# Patient Record
Sex: Female | Born: 1950 | Race: White | Hispanic: Refuse to answer | Marital: Married | Smoking: Never smoker
Health system: Southern US, Community
[De-identification: ages and names within clinical notes are randomized; demographics above are authoritative.]

## PROBLEM LIST (undated history)

## (undated) DIAGNOSIS — I498 Other specified cardiac arrhythmias: Secondary | ICD-10-CM

## (undated) DIAGNOSIS — G43009 Migraine without aura, not intractable, without status migrainosus: Secondary | ICD-10-CM

## (undated) DIAGNOSIS — I10 Essential (primary) hypertension: Secondary | ICD-10-CM

## (undated) DIAGNOSIS — E034 Atrophy of thyroid (acquired): Secondary | ICD-10-CM

## (undated) DIAGNOSIS — I493 Ventricular premature depolarization: Secondary | ICD-10-CM

## (undated) DIAGNOSIS — J4520 Mild intermittent asthma, uncomplicated: Secondary | ICD-10-CM

## (undated) DIAGNOSIS — Z9889 Other specified postprocedural states: Secondary | ICD-10-CM

## (undated) DIAGNOSIS — J45909 Unspecified asthma, uncomplicated: Secondary | ICD-10-CM

## (undated) DIAGNOSIS — H6011 Cellulitis of right external ear: Secondary | ICD-10-CM

## (undated) HISTORY — DX: Other specified cardiac arrhythmias: I49.8

## (undated) HISTORY — DX: Unspecified asthma, uncomplicated: J45.909

## (undated) HISTORY — PX: CHOLECYSTECTOMY: SHX55

## (undated) HISTORY — DX: Atrophy of thyroid (acquired): E03.4

## (undated) HISTORY — PX: ABDOMINAL HYSTERECTOMY: SHX81

## (undated) HISTORY — DX: Migraine without aura, not intractable, without status migrainosus: G43.009

## (undated) HISTORY — DX: Mild intermittent asthma, uncomplicated: J45.20

## (undated) HISTORY — DX: Ventricular premature depolarization: I49.3

## (undated) HISTORY — DX: Essential (primary) hypertension: I10

## (undated) HISTORY — DX: Cellulitis of right external ear: H60.11

## (undated) HISTORY — DX: Other specified postprocedural states: Z98.890

---

## 2005-09-24 ENCOUNTER — Ambulatory Visit (HOSPITAL_BASED_OUTPATIENT_CLINIC_OR_DEPARTMENT_OTHER): Admission: RE | Admit: 2005-09-24 | Discharge: 2005-09-24 | Payer: Self-pay | Admitting: Orthopedic Surgery

## 2015-04-12 DIAGNOSIS — C44722 Squamous cell carcinoma of skin of right lower limb, including hip: Secondary | ICD-10-CM | POA: Diagnosis not present

## 2015-04-12 DIAGNOSIS — L300 Nummular dermatitis: Secondary | ICD-10-CM | POA: Diagnosis not present

## 2015-04-12 DIAGNOSIS — L820 Inflamed seborrheic keratosis: Secondary | ICD-10-CM | POA: Diagnosis not present

## 2015-05-15 DIAGNOSIS — J018 Other acute sinusitis: Secondary | ICD-10-CM | POA: Diagnosis not present

## 2015-07-03 DIAGNOSIS — I10 Essential (primary) hypertension: Secondary | ICD-10-CM | POA: Diagnosis not present

## 2015-07-03 DIAGNOSIS — I498 Other specified cardiac arrhythmias: Secondary | ICD-10-CM | POA: Diagnosis not present

## 2015-11-23 DIAGNOSIS — G43801 Other migraine, not intractable, with status migrainosus: Secondary | ICD-10-CM | POA: Diagnosis not present

## 2015-11-23 DIAGNOSIS — H43813 Vitreous degeneration, bilateral: Secondary | ICD-10-CM | POA: Diagnosis not present

## 2015-11-29 DIAGNOSIS — G43009 Migraine without aura, not intractable, without status migrainosus: Secondary | ICD-10-CM | POA: Diagnosis not present

## 2015-12-12 DIAGNOSIS — G43909 Migraine, unspecified, not intractable, without status migrainosus: Secondary | ICD-10-CM | POA: Diagnosis not present

## 2015-12-12 DIAGNOSIS — R51 Headache: Secondary | ICD-10-CM | POA: Diagnosis not present

## 2016-01-08 DIAGNOSIS — I10 Essential (primary) hypertension: Secondary | ICD-10-CM | POA: Diagnosis not present

## 2016-02-26 DIAGNOSIS — M542 Cervicalgia: Secondary | ICD-10-CM | POA: Diagnosis not present

## 2016-03-08 DIAGNOSIS — M47892 Other spondylosis, cervical region: Secondary | ICD-10-CM | POA: Diagnosis not present

## 2016-03-08 DIAGNOSIS — M542 Cervicalgia: Secondary | ICD-10-CM | POA: Diagnosis not present

## 2016-03-08 DIAGNOSIS — M8588 Other specified disorders of bone density and structure, other site: Secondary | ICD-10-CM | POA: Diagnosis not present

## 2016-03-08 DIAGNOSIS — M47893 Other spondylosis, cervicothoracic region: Secondary | ICD-10-CM | POA: Diagnosis not present

## 2016-03-20 DIAGNOSIS — M6281 Muscle weakness (generalized): Secondary | ICD-10-CM | POA: Diagnosis not present

## 2016-03-20 DIAGNOSIS — M47892 Other spondylosis, cervical region: Secondary | ICD-10-CM | POA: Diagnosis not present

## 2016-03-20 DIAGNOSIS — M542 Cervicalgia: Secondary | ICD-10-CM | POA: Diagnosis not present

## 2016-03-20 DIAGNOSIS — S161XXD Strain of muscle, fascia and tendon at neck level, subsequent encounter: Secondary | ICD-10-CM | POA: Diagnosis not present

## 2016-03-20 DIAGNOSIS — R293 Abnormal posture: Secondary | ICD-10-CM | POA: Diagnosis not present

## 2016-03-22 DIAGNOSIS — M6281 Muscle weakness (generalized): Secondary | ICD-10-CM | POA: Diagnosis not present

## 2016-03-22 DIAGNOSIS — R293 Abnormal posture: Secondary | ICD-10-CM | POA: Diagnosis not present

## 2016-03-22 DIAGNOSIS — M47892 Other spondylosis, cervical region: Secondary | ICD-10-CM | POA: Diagnosis not present

## 2016-03-22 DIAGNOSIS — M542 Cervicalgia: Secondary | ICD-10-CM | POA: Diagnosis not present

## 2016-03-22 DIAGNOSIS — S161XXD Strain of muscle, fascia and tendon at neck level, subsequent encounter: Secondary | ICD-10-CM | POA: Diagnosis not present

## 2016-03-25 DIAGNOSIS — M542 Cervicalgia: Secondary | ICD-10-CM | POA: Diagnosis not present

## 2016-03-25 DIAGNOSIS — M6281 Muscle weakness (generalized): Secondary | ICD-10-CM | POA: Diagnosis not present

## 2016-03-25 DIAGNOSIS — S161XXD Strain of muscle, fascia and tendon at neck level, subsequent encounter: Secondary | ICD-10-CM | POA: Diagnosis not present

## 2016-03-25 DIAGNOSIS — M47892 Other spondylosis, cervical region: Secondary | ICD-10-CM | POA: Diagnosis not present

## 2016-03-25 DIAGNOSIS — R293 Abnormal posture: Secondary | ICD-10-CM | POA: Diagnosis not present

## 2016-03-27 DIAGNOSIS — M47892 Other spondylosis, cervical region: Secondary | ICD-10-CM | POA: Diagnosis not present

## 2016-03-27 DIAGNOSIS — R293 Abnormal posture: Secondary | ICD-10-CM | POA: Diagnosis not present

## 2016-03-27 DIAGNOSIS — S161XXD Strain of muscle, fascia and tendon at neck level, subsequent encounter: Secondary | ICD-10-CM | POA: Diagnosis not present

## 2016-03-27 DIAGNOSIS — M542 Cervicalgia: Secondary | ICD-10-CM | POA: Diagnosis not present

## 2016-03-27 DIAGNOSIS — M6281 Muscle weakness (generalized): Secondary | ICD-10-CM | POA: Diagnosis not present

## 2016-03-29 DIAGNOSIS — R293 Abnormal posture: Secondary | ICD-10-CM | POA: Diagnosis not present

## 2016-03-29 DIAGNOSIS — S161XXD Strain of muscle, fascia and tendon at neck level, subsequent encounter: Secondary | ICD-10-CM | POA: Diagnosis not present

## 2016-03-29 DIAGNOSIS — M6281 Muscle weakness (generalized): Secondary | ICD-10-CM | POA: Diagnosis not present

## 2016-03-29 DIAGNOSIS — M542 Cervicalgia: Secondary | ICD-10-CM | POA: Diagnosis not present

## 2016-03-29 DIAGNOSIS — M47892 Other spondylosis, cervical region: Secondary | ICD-10-CM | POA: Diagnosis not present

## 2016-04-01 DIAGNOSIS — R293 Abnormal posture: Secondary | ICD-10-CM | POA: Diagnosis not present

## 2016-04-01 DIAGNOSIS — M6281 Muscle weakness (generalized): Secondary | ICD-10-CM | POA: Diagnosis not present

## 2016-04-01 DIAGNOSIS — M542 Cervicalgia: Secondary | ICD-10-CM | POA: Diagnosis not present

## 2016-04-01 DIAGNOSIS — S161XXD Strain of muscle, fascia and tendon at neck level, subsequent encounter: Secondary | ICD-10-CM | POA: Diagnosis not present

## 2016-04-01 DIAGNOSIS — M47892 Other spondylosis, cervical region: Secondary | ICD-10-CM | POA: Diagnosis not present

## 2016-04-03 DIAGNOSIS — M47892 Other spondylosis, cervical region: Secondary | ICD-10-CM | POA: Diagnosis not present

## 2016-04-03 DIAGNOSIS — R293 Abnormal posture: Secondary | ICD-10-CM | POA: Diagnosis not present

## 2016-04-03 DIAGNOSIS — M6281 Muscle weakness (generalized): Secondary | ICD-10-CM | POA: Diagnosis not present

## 2016-04-03 DIAGNOSIS — S161XXD Strain of muscle, fascia and tendon at neck level, subsequent encounter: Secondary | ICD-10-CM | POA: Diagnosis not present

## 2016-04-03 DIAGNOSIS — M542 Cervicalgia: Secondary | ICD-10-CM | POA: Diagnosis not present

## 2016-04-05 DIAGNOSIS — R293 Abnormal posture: Secondary | ICD-10-CM | POA: Diagnosis not present

## 2016-04-05 DIAGNOSIS — M542 Cervicalgia: Secondary | ICD-10-CM | POA: Diagnosis not present

## 2016-04-05 DIAGNOSIS — M6281 Muscle weakness (generalized): Secondary | ICD-10-CM | POA: Diagnosis not present

## 2016-04-05 DIAGNOSIS — M47892 Other spondylosis, cervical region: Secondary | ICD-10-CM | POA: Diagnosis not present

## 2016-04-05 DIAGNOSIS — S161XXD Strain of muscle, fascia and tendon at neck level, subsequent encounter: Secondary | ICD-10-CM | POA: Diagnosis not present

## 2016-04-09 DIAGNOSIS — R293 Abnormal posture: Secondary | ICD-10-CM | POA: Diagnosis not present

## 2016-04-09 DIAGNOSIS — M6281 Muscle weakness (generalized): Secondary | ICD-10-CM | POA: Diagnosis not present

## 2016-04-09 DIAGNOSIS — M47892 Other spondylosis, cervical region: Secondary | ICD-10-CM | POA: Diagnosis not present

## 2016-04-09 DIAGNOSIS — S161XXD Strain of muscle, fascia and tendon at neck level, subsequent encounter: Secondary | ICD-10-CM | POA: Diagnosis not present

## 2016-04-09 DIAGNOSIS — M542 Cervicalgia: Secondary | ICD-10-CM | POA: Diagnosis not present

## 2016-04-11 DIAGNOSIS — M6281 Muscle weakness (generalized): Secondary | ICD-10-CM | POA: Diagnosis not present

## 2016-04-11 DIAGNOSIS — M542 Cervicalgia: Secondary | ICD-10-CM | POA: Diagnosis not present

## 2016-04-11 DIAGNOSIS — S161XXD Strain of muscle, fascia and tendon at neck level, subsequent encounter: Secondary | ICD-10-CM | POA: Diagnosis not present

## 2016-04-11 DIAGNOSIS — R293 Abnormal posture: Secondary | ICD-10-CM | POA: Diagnosis not present

## 2016-04-11 DIAGNOSIS — M47892 Other spondylosis, cervical region: Secondary | ICD-10-CM | POA: Diagnosis not present

## 2016-04-16 DIAGNOSIS — R293 Abnormal posture: Secondary | ICD-10-CM | POA: Diagnosis not present

## 2016-04-16 DIAGNOSIS — M542 Cervicalgia: Secondary | ICD-10-CM | POA: Diagnosis not present

## 2016-04-16 DIAGNOSIS — M6281 Muscle weakness (generalized): Secondary | ICD-10-CM | POA: Diagnosis not present

## 2016-04-16 DIAGNOSIS — M47892 Other spondylosis, cervical region: Secondary | ICD-10-CM | POA: Diagnosis not present

## 2016-04-16 DIAGNOSIS — S161XXD Strain of muscle, fascia and tendon at neck level, subsequent encounter: Secondary | ICD-10-CM | POA: Diagnosis not present

## 2016-04-19 DIAGNOSIS — S161XXD Strain of muscle, fascia and tendon at neck level, subsequent encounter: Secondary | ICD-10-CM | POA: Diagnosis not present

## 2016-04-19 DIAGNOSIS — R293 Abnormal posture: Secondary | ICD-10-CM | POA: Diagnosis not present

## 2016-04-19 DIAGNOSIS — M6281 Muscle weakness (generalized): Secondary | ICD-10-CM | POA: Diagnosis not present

## 2016-04-19 DIAGNOSIS — M47892 Other spondylosis, cervical region: Secondary | ICD-10-CM | POA: Diagnosis not present

## 2016-04-19 DIAGNOSIS — M542 Cervicalgia: Secondary | ICD-10-CM | POA: Diagnosis not present

## 2016-04-23 DIAGNOSIS — M6281 Muscle weakness (generalized): Secondary | ICD-10-CM | POA: Diagnosis not present

## 2016-04-23 DIAGNOSIS — M542 Cervicalgia: Secondary | ICD-10-CM | POA: Diagnosis not present

## 2016-04-23 DIAGNOSIS — S161XXD Strain of muscle, fascia and tendon at neck level, subsequent encounter: Secondary | ICD-10-CM | POA: Diagnosis not present

## 2016-04-23 DIAGNOSIS — M47892 Other spondylosis, cervical region: Secondary | ICD-10-CM | POA: Diagnosis not present

## 2016-04-23 DIAGNOSIS — R293 Abnormal posture: Secondary | ICD-10-CM | POA: Diagnosis not present

## 2016-04-26 DIAGNOSIS — S161XXD Strain of muscle, fascia and tendon at neck level, subsequent encounter: Secondary | ICD-10-CM | POA: Diagnosis not present

## 2016-04-26 DIAGNOSIS — M47892 Other spondylosis, cervical region: Secondary | ICD-10-CM | POA: Diagnosis not present

## 2016-04-26 DIAGNOSIS — M542 Cervicalgia: Secondary | ICD-10-CM | POA: Diagnosis not present

## 2016-04-26 DIAGNOSIS — M6281 Muscle weakness (generalized): Secondary | ICD-10-CM | POA: Diagnosis not present

## 2016-04-26 DIAGNOSIS — R293 Abnormal posture: Secondary | ICD-10-CM | POA: Diagnosis not present

## 2016-04-30 ENCOUNTER — Other Ambulatory Visit: Payer: Self-pay | Admitting: Medical

## 2016-04-30 DIAGNOSIS — M542 Cervicalgia: Secondary | ICD-10-CM

## 2016-04-30 DIAGNOSIS — M47892 Other spondylosis, cervical region: Secondary | ICD-10-CM | POA: Diagnosis not present

## 2016-04-30 DIAGNOSIS — M6281 Muscle weakness (generalized): Secondary | ICD-10-CM | POA: Diagnosis not present

## 2016-04-30 DIAGNOSIS — R293 Abnormal posture: Secondary | ICD-10-CM | POA: Diagnosis not present

## 2016-04-30 DIAGNOSIS — S161XXD Strain of muscle, fascia and tendon at neck level, subsequent encounter: Secondary | ICD-10-CM | POA: Diagnosis not present

## 2016-05-03 DIAGNOSIS — R293 Abnormal posture: Secondary | ICD-10-CM | POA: Diagnosis not present

## 2016-05-03 DIAGNOSIS — M47892 Other spondylosis, cervical region: Secondary | ICD-10-CM | POA: Diagnosis not present

## 2016-05-03 DIAGNOSIS — S161XXD Strain of muscle, fascia and tendon at neck level, subsequent encounter: Secondary | ICD-10-CM | POA: Diagnosis not present

## 2016-05-03 DIAGNOSIS — M542 Cervicalgia: Secondary | ICD-10-CM | POA: Diagnosis not present

## 2016-05-03 DIAGNOSIS — M6281 Muscle weakness (generalized): Secondary | ICD-10-CM | POA: Diagnosis not present

## 2016-05-07 DIAGNOSIS — R293 Abnormal posture: Secondary | ICD-10-CM | POA: Diagnosis not present

## 2016-05-07 DIAGNOSIS — M6281 Muscle weakness (generalized): Secondary | ICD-10-CM | POA: Diagnosis not present

## 2016-05-07 DIAGNOSIS — S161XXD Strain of muscle, fascia and tendon at neck level, subsequent encounter: Secondary | ICD-10-CM | POA: Diagnosis not present

## 2016-05-07 DIAGNOSIS — M542 Cervicalgia: Secondary | ICD-10-CM | POA: Diagnosis not present

## 2016-05-07 DIAGNOSIS — M47892 Other spondylosis, cervical region: Secondary | ICD-10-CM | POA: Diagnosis not present

## 2016-05-10 DIAGNOSIS — M6281 Muscle weakness (generalized): Secondary | ICD-10-CM | POA: Diagnosis not present

## 2016-05-10 DIAGNOSIS — M542 Cervicalgia: Secondary | ICD-10-CM | POA: Diagnosis not present

## 2016-05-10 DIAGNOSIS — R293 Abnormal posture: Secondary | ICD-10-CM | POA: Diagnosis not present

## 2016-05-10 DIAGNOSIS — M47892 Other spondylosis, cervical region: Secondary | ICD-10-CM | POA: Diagnosis not present

## 2016-05-10 DIAGNOSIS — S161XXD Strain of muscle, fascia and tendon at neck level, subsequent encounter: Secondary | ICD-10-CM | POA: Diagnosis not present

## 2016-05-13 ENCOUNTER — Ambulatory Visit
Admission: RE | Admit: 2016-05-13 | Discharge: 2016-05-13 | Disposition: A | Discharge status: Home / Self Care | Payer: PPO | Source: Ambulatory Visit | Attending: Medical | Admitting: Medical

## 2016-05-13 DIAGNOSIS — M47812 Spondylosis without myelopathy or radiculopathy, cervical region: Secondary | ICD-10-CM | POA: Diagnosis not present

## 2016-05-13 DIAGNOSIS — M542 Cervicalgia: Secondary | ICD-10-CM

## 2016-05-14 DIAGNOSIS — M47892 Other spondylosis, cervical region: Secondary | ICD-10-CM | POA: Diagnosis not present

## 2016-05-14 DIAGNOSIS — M6281 Muscle weakness (generalized): Secondary | ICD-10-CM | POA: Diagnosis not present

## 2016-05-14 DIAGNOSIS — M542 Cervicalgia: Secondary | ICD-10-CM | POA: Diagnosis not present

## 2016-05-14 DIAGNOSIS — R293 Abnormal posture: Secondary | ICD-10-CM | POA: Diagnosis not present

## 2016-05-14 DIAGNOSIS — S161XXD Strain of muscle, fascia and tendon at neck level, subsequent encounter: Secondary | ICD-10-CM | POA: Diagnosis not present

## 2016-05-17 DIAGNOSIS — S161XXD Strain of muscle, fascia and tendon at neck level, subsequent encounter: Secondary | ICD-10-CM | POA: Diagnosis not present

## 2016-05-17 DIAGNOSIS — R293 Abnormal posture: Secondary | ICD-10-CM | POA: Diagnosis not present

## 2016-05-17 DIAGNOSIS — M542 Cervicalgia: Secondary | ICD-10-CM | POA: Diagnosis not present

## 2016-05-17 DIAGNOSIS — M6281 Muscle weakness (generalized): Secondary | ICD-10-CM | POA: Diagnosis not present

## 2016-05-17 DIAGNOSIS — M47892 Other spondylosis, cervical region: Secondary | ICD-10-CM | POA: Diagnosis not present

## 2016-05-21 DIAGNOSIS — M47892 Other spondylosis, cervical region: Secondary | ICD-10-CM | POA: Diagnosis not present

## 2016-05-21 DIAGNOSIS — M542 Cervicalgia: Secondary | ICD-10-CM | POA: Diagnosis not present

## 2016-05-21 DIAGNOSIS — M6281 Muscle weakness (generalized): Secondary | ICD-10-CM | POA: Diagnosis not present

## 2016-05-21 DIAGNOSIS — R293 Abnormal posture: Secondary | ICD-10-CM | POA: Diagnosis not present

## 2016-05-21 DIAGNOSIS — S161XXD Strain of muscle, fascia and tendon at neck level, subsequent encounter: Secondary | ICD-10-CM | POA: Diagnosis not present

## 2016-05-23 DIAGNOSIS — S161XXD Strain of muscle, fascia and tendon at neck level, subsequent encounter: Secondary | ICD-10-CM | POA: Diagnosis not present

## 2016-05-23 DIAGNOSIS — M47892 Other spondylosis, cervical region: Secondary | ICD-10-CM | POA: Diagnosis not present

## 2016-05-23 DIAGNOSIS — M6281 Muscle weakness (generalized): Secondary | ICD-10-CM | POA: Diagnosis not present

## 2016-05-23 DIAGNOSIS — M542 Cervicalgia: Secondary | ICD-10-CM | POA: Diagnosis not present

## 2016-05-23 DIAGNOSIS — R293 Abnormal posture: Secondary | ICD-10-CM | POA: Diagnosis not present

## 2016-05-28 DIAGNOSIS — R293 Abnormal posture: Secondary | ICD-10-CM | POA: Diagnosis not present

## 2016-05-28 DIAGNOSIS — M47892 Other spondylosis, cervical region: Secondary | ICD-10-CM | POA: Diagnosis not present

## 2016-05-28 DIAGNOSIS — M542 Cervicalgia: Secondary | ICD-10-CM | POA: Diagnosis not present

## 2016-05-28 DIAGNOSIS — S161XXD Strain of muscle, fascia and tendon at neck level, subsequent encounter: Secondary | ICD-10-CM | POA: Diagnosis not present

## 2016-05-28 DIAGNOSIS — M6281 Muscle weakness (generalized): Secondary | ICD-10-CM | POA: Diagnosis not present

## 2016-05-31 DIAGNOSIS — M47892 Other spondylosis, cervical region: Secondary | ICD-10-CM | POA: Diagnosis not present

## 2016-05-31 DIAGNOSIS — M6281 Muscle weakness (generalized): Secondary | ICD-10-CM | POA: Diagnosis not present

## 2016-05-31 DIAGNOSIS — S161XXD Strain of muscle, fascia and tendon at neck level, subsequent encounter: Secondary | ICD-10-CM | POA: Diagnosis not present

## 2016-05-31 DIAGNOSIS — M542 Cervicalgia: Secondary | ICD-10-CM | POA: Diagnosis not present

## 2016-05-31 DIAGNOSIS — R293 Abnormal posture: Secondary | ICD-10-CM | POA: Diagnosis not present

## 2016-06-07 DIAGNOSIS — S161XXD Strain of muscle, fascia and tendon at neck level, subsequent encounter: Secondary | ICD-10-CM | POA: Diagnosis not present

## 2016-06-07 DIAGNOSIS — M47892 Other spondylosis, cervical region: Secondary | ICD-10-CM | POA: Diagnosis not present

## 2016-06-07 DIAGNOSIS — M6281 Muscle weakness (generalized): Secondary | ICD-10-CM | POA: Diagnosis not present

## 2016-06-07 DIAGNOSIS — R293 Abnormal posture: Secondary | ICD-10-CM | POA: Diagnosis not present

## 2016-06-07 DIAGNOSIS — M542 Cervicalgia: Secondary | ICD-10-CM | POA: Diagnosis not present

## 2016-07-08 DIAGNOSIS — I10 Essential (primary) hypertension: Secondary | ICD-10-CM | POA: Diagnosis not present

## 2016-07-08 DIAGNOSIS — I498 Other specified cardiac arrhythmias: Secondary | ICD-10-CM | POA: Diagnosis not present

## 2016-09-10 DIAGNOSIS — M545 Low back pain: Secondary | ICD-10-CM | POA: Diagnosis not present

## 2016-09-23 DIAGNOSIS — Z01419 Encounter for gynecological examination (general) (routine) without abnormal findings: Secondary | ICD-10-CM | POA: Diagnosis not present

## 2016-09-23 DIAGNOSIS — Z90710 Acquired absence of both cervix and uterus: Secondary | ICD-10-CM | POA: Diagnosis not present

## 2016-09-23 DIAGNOSIS — L900 Lichen sclerosus et atrophicus: Secondary | ICD-10-CM | POA: Diagnosis not present

## 2016-09-25 DIAGNOSIS — M8588 Other specified disorders of bone density and structure, other site: Secondary | ICD-10-CM | POA: Diagnosis not present

## 2016-09-25 DIAGNOSIS — N959 Unspecified menopausal and perimenopausal disorder: Secondary | ICD-10-CM | POA: Diagnosis not present

## 2017-01-08 DIAGNOSIS — I498 Other specified cardiac arrhythmias: Secondary | ICD-10-CM | POA: Diagnosis not present

## 2017-01-08 DIAGNOSIS — I10 Essential (primary) hypertension: Secondary | ICD-10-CM | POA: Diagnosis not present

## 2017-02-17 DIAGNOSIS — R112 Nausea with vomiting, unspecified: Secondary | ICD-10-CM | POA: Diagnosis not present

## 2017-02-17 DIAGNOSIS — J00 Acute nasopharyngitis [common cold]: Secondary | ICD-10-CM | POA: Diagnosis not present

## 2017-02-18 DIAGNOSIS — I10 Essential (primary) hypertension: Secondary | ICD-10-CM | POA: Diagnosis not present

## 2017-02-18 DIAGNOSIS — R197 Diarrhea, unspecified: Secondary | ICD-10-CM | POA: Diagnosis not present

## 2017-02-18 DIAGNOSIS — R6510 Systemic inflammatory response syndrome (SIRS) of non-infectious origin without acute organ dysfunction: Secondary | ICD-10-CM | POA: Diagnosis not present

## 2017-02-18 DIAGNOSIS — D72829 Elevated white blood cell count, unspecified: Secondary | ICD-10-CM | POA: Diagnosis not present

## 2017-02-18 DIAGNOSIS — L03211 Cellulitis of face: Secondary | ICD-10-CM | POA: Diagnosis not present

## 2017-02-18 DIAGNOSIS — Z882 Allergy status to sulfonamides status: Secondary | ICD-10-CM | POA: Diagnosis not present

## 2017-02-18 DIAGNOSIS — R110 Nausea: Secondary | ICD-10-CM | POA: Diagnosis not present

## 2017-02-18 DIAGNOSIS — E860 Dehydration: Secondary | ICD-10-CM | POA: Diagnosis not present

## 2017-02-18 DIAGNOSIS — R112 Nausea with vomiting, unspecified: Secondary | ICD-10-CM | POA: Diagnosis not present

## 2017-02-19 DIAGNOSIS — D72829 Elevated white blood cell count, unspecified: Secondary | ICD-10-CM | POA: Diagnosis not present

## 2017-02-19 DIAGNOSIS — R197 Diarrhea, unspecified: Secondary | ICD-10-CM | POA: Diagnosis not present

## 2017-02-19 DIAGNOSIS — E860 Dehydration: Secondary | ICD-10-CM | POA: Diagnosis not present

## 2017-02-19 DIAGNOSIS — L03211 Cellulitis of face: Secondary | ICD-10-CM | POA: Diagnosis not present

## 2017-02-19 DIAGNOSIS — R110 Nausea: Secondary | ICD-10-CM | POA: Diagnosis not present

## 2017-02-24 DIAGNOSIS — L03211 Cellulitis of face: Secondary | ICD-10-CM | POA: Diagnosis not present

## 2017-02-25 ENCOUNTER — Other Ambulatory Visit: Payer: Self-pay

## 2017-02-25 NOTE — Patient Outreach (Signed)
[facility]) Care Management  02/25/2017  Ambry Dix 1950-12-29 [id number]  Transition of care  Referral date: 02/25/17 Referral source: discharged from [facility] on 02/20/17 Insurance: [employer] team advantage  Telephone call to patient regarding transition of care referral. HIPAA verified with patient. Discussed transition of care program with patient. Patient states she was in the hospital due to facial cellulitis. Patient states she is still taking  Oral antibiotics.  Patient states she is seeing some improvement in her face.  Patient reports she had a follow up appointment with her primary provider on 02/24/17. Patient states her primary provider did not make any changes to her treatment plan.  RNCM advised patient to notify doctor for any new symptoms.  RNCM advised patient to continue taking her antibiotics as directed by her doctor. RNCM advised patient to keep follow up appointment with her doctor. Marland Kitchen RNCM advised patient to notify MD of any changes in condition prior to scheduled appointment. Patient denied need for ongoing transition of care follow up calls.  RNCM provided contact name and number: [phone] or main office number [phone] and 24 hour nurse advise line [phone].  RNCM verified patient aware of 911 services for urgent/ emergent needs.   PLAN: RNCM will refer patient to care management assistant to close due to refusal of services.  RNCM will send patient primary provider notice of closure.  RNCM will send patient [facility] care management outreach letter/ brochure.   Quinn Plowman RN,BSN,CCM [facility] Telephonic  [phone]

## 2017-07-09 DIAGNOSIS — G43009 Migraine without aura, not intractable, without status migrainosus: Secondary | ICD-10-CM | POA: Diagnosis not present

## 2017-07-09 DIAGNOSIS — I498 Other specified cardiac arrhythmias: Secondary | ICD-10-CM | POA: Diagnosis not present

## 2017-07-09 DIAGNOSIS — I10 Essential (primary) hypertension: Secondary | ICD-10-CM | POA: Diagnosis not present

## 2017-08-29 DIAGNOSIS — J208 Acute bronchitis due to other specified organisms: Secondary | ICD-10-CM | POA: Diagnosis not present

## 2017-08-29 DIAGNOSIS — J4521 Mild intermittent asthma with (acute) exacerbation: Secondary | ICD-10-CM | POA: Diagnosis not present

## 2017-09-09 DIAGNOSIS — J4521 Mild intermittent asthma with (acute) exacerbation: Secondary | ICD-10-CM | POA: Diagnosis not present

## 2017-09-09 DIAGNOSIS — J208 Acute bronchitis due to other specified organisms: Secondary | ICD-10-CM | POA: Diagnosis not present

## 2018-01-08 DIAGNOSIS — G43009 Migraine without aura, not intractable, without status migrainosus: Secondary | ICD-10-CM | POA: Diagnosis not present

## 2018-01-08 DIAGNOSIS — I10 Essential (primary) hypertension: Secondary | ICD-10-CM | POA: Diagnosis not present

## 2018-01-08 DIAGNOSIS — I498 Other specified cardiac arrhythmias: Secondary | ICD-10-CM | POA: Diagnosis not present

## 2018-05-18 DIAGNOSIS — L658 Other specified nonscarring hair loss: Secondary | ICD-10-CM | POA: Diagnosis not present

## 2018-05-19 DIAGNOSIS — L658 Other specified nonscarring hair loss: Secondary | ICD-10-CM | POA: Diagnosis not present

## 2018-06-26 IMAGING — MR MR CERVICAL SPINE W/O CM
4 of 5 series · 25 of 48 positions shown · non-contrast
Comparison: Cervical spine radiographs March 08, 2016

CLINICAL DATA: Constant neck pain and stiffness since November 2015

EXAM:
MRI CERVICAL SPINE WITHOUT CONTRAST
TECHNIQUE: Multiplanar, multisequence MR imaging of the cervical spine was
performed. No intravenous contrast was administered.

[Series 4: T1 · sagittal · 3.5mm · 0.35mm/px · 6 of 13 slices shown]
[im 1/13]
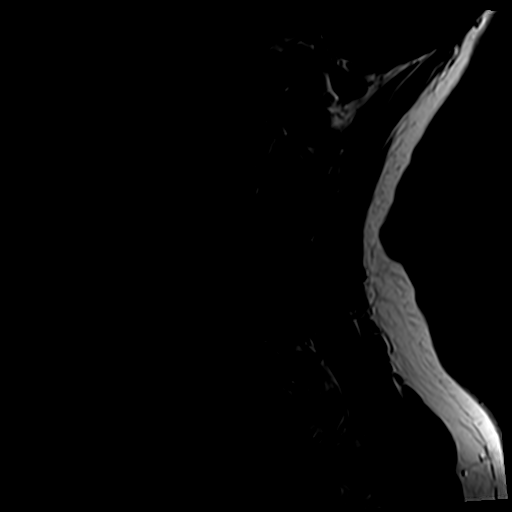
[im 2/13]
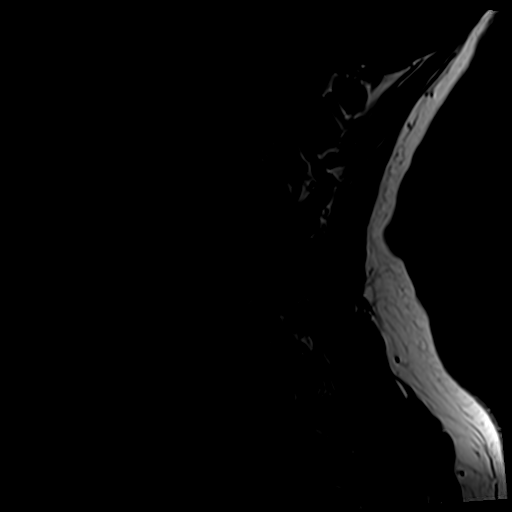
[im 4/13]
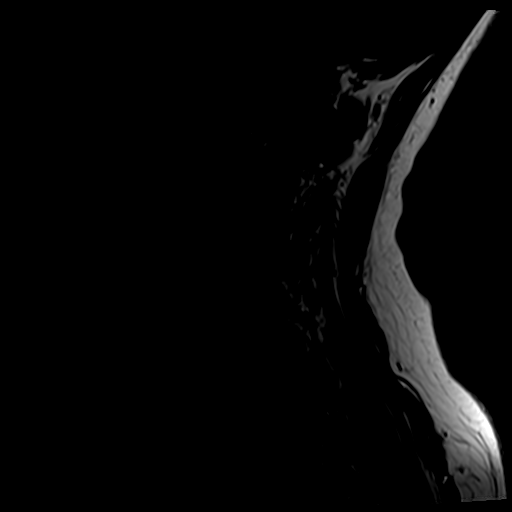
[im 6/13]
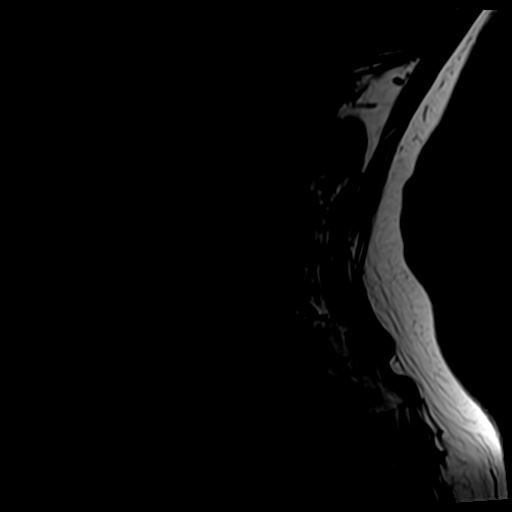
[im 7/13]
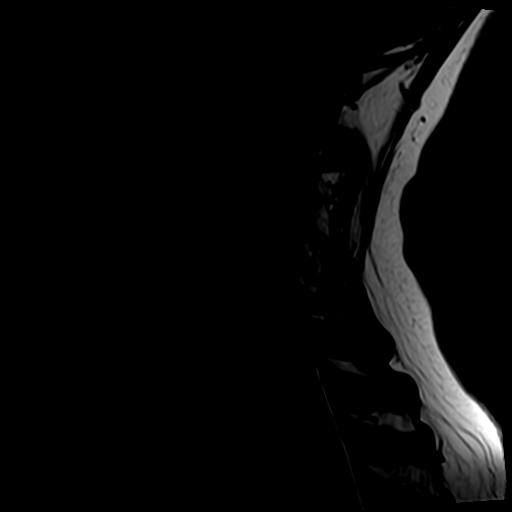
[im 11/13]
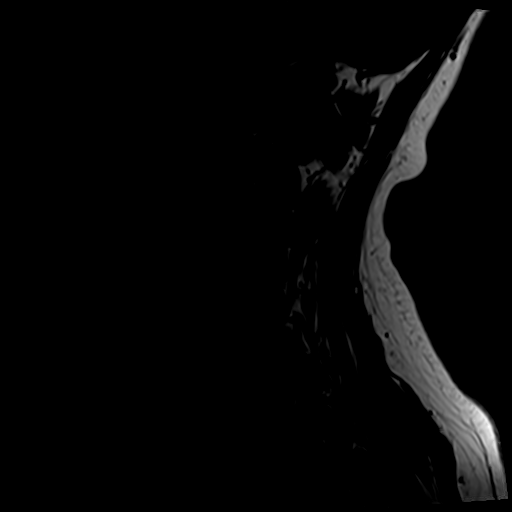

[Series 5: tir sag · sagittal · 3.5mm · 0.35mm/px · 3 of 13 slices shown]
[im 3/13]
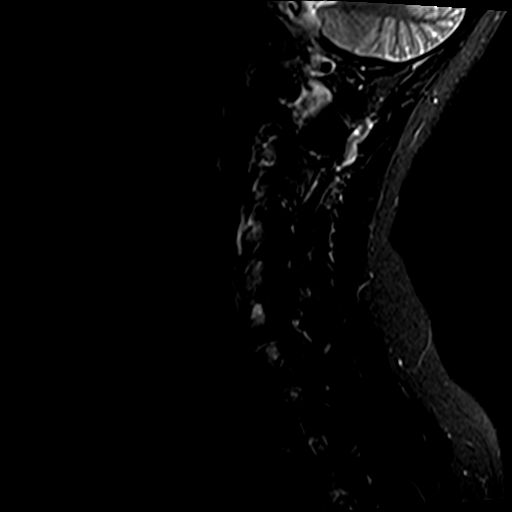
[im 7/13]
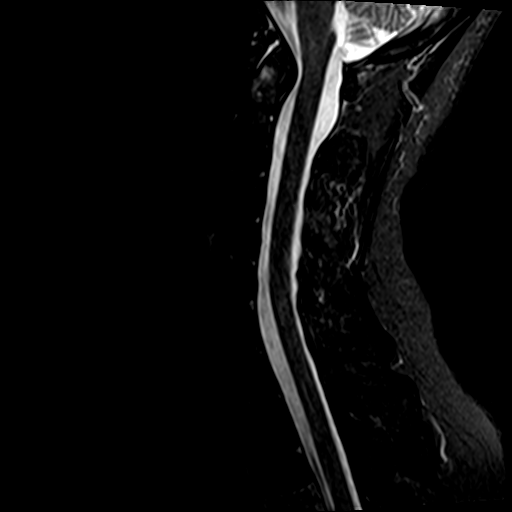
[im 11/13]
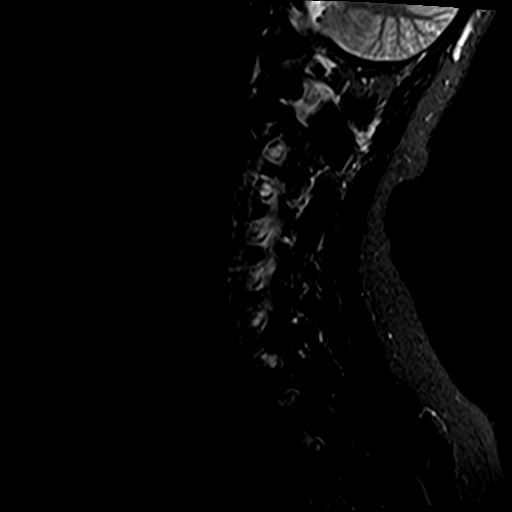

[Series 6: T2 · axial · 3.0mm · 0.62mm/px · z∈[-86,-2]mm · 9 of 24 slices shown (1 of 2)]
[im 1/24]
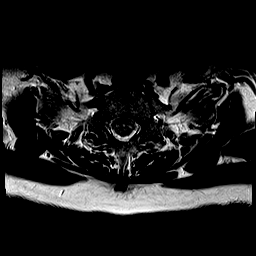
[im 4/24]
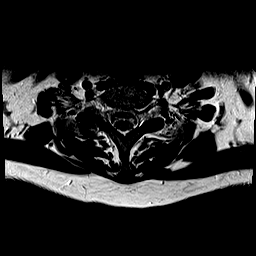
[im 8/24]
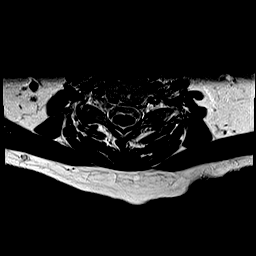
[im 10/24]
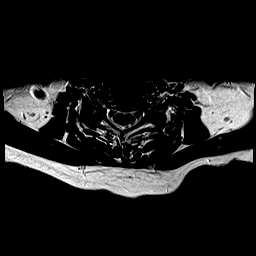
[im 12/24]
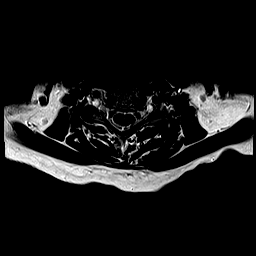
[im 14/24]
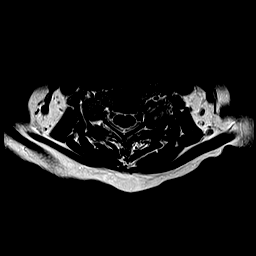
[im 16/24]
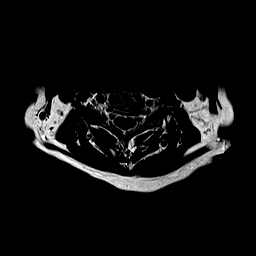
[im 20/24]
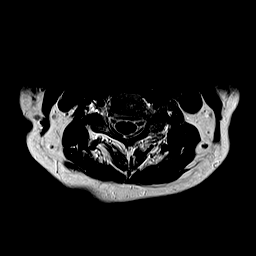
[im 24/24]
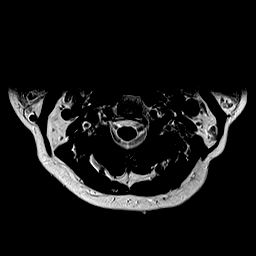

[Series 8: T2 · sagittal · 3.5mm · 0.35mm/px · 7 of 13 slices shown (2 of 2)]
[im 1/13]
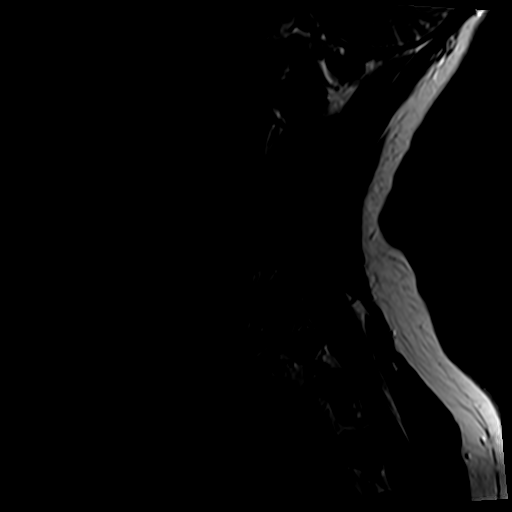
[im 3/13]
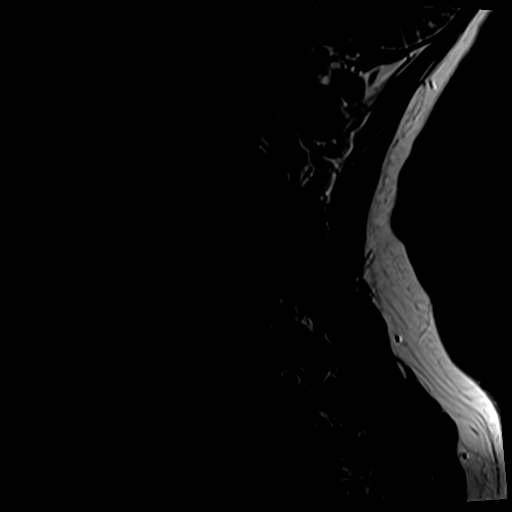
[im 5/13]
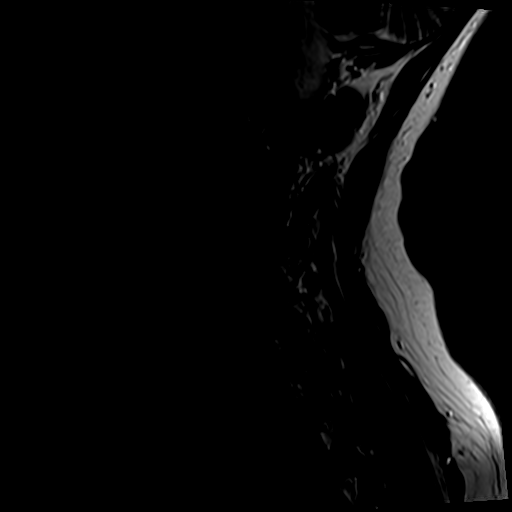
[im 7/13]
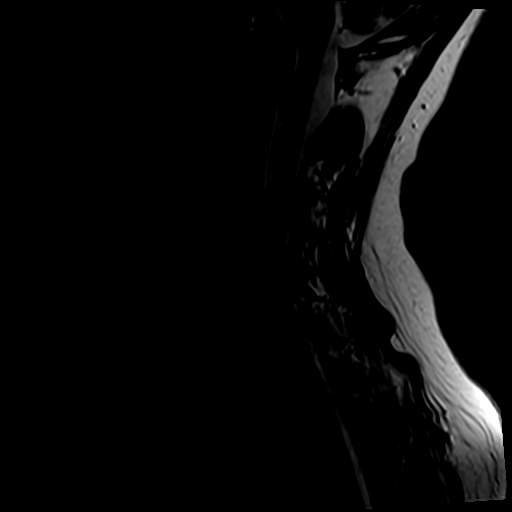
[im 9/13]
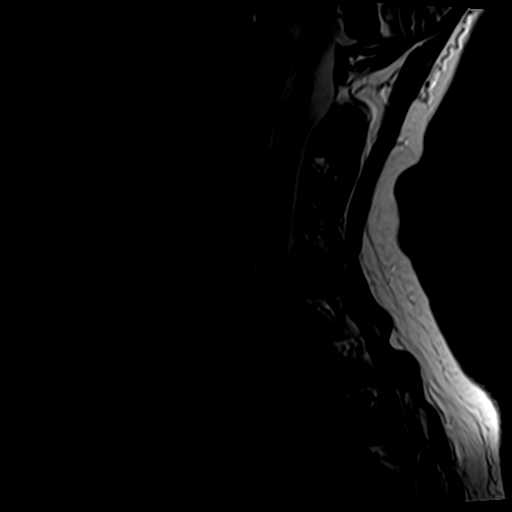
[im 11/13]
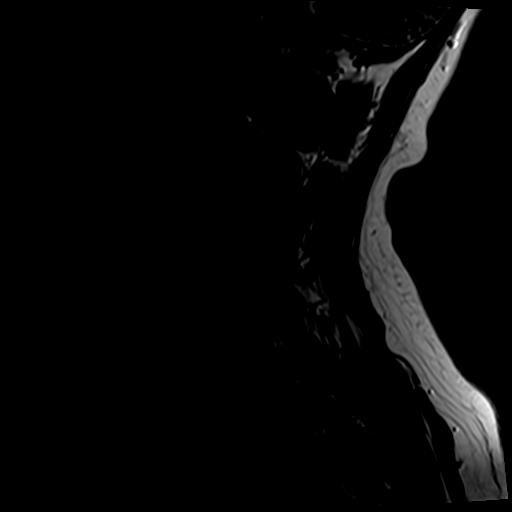
[im 13/13]
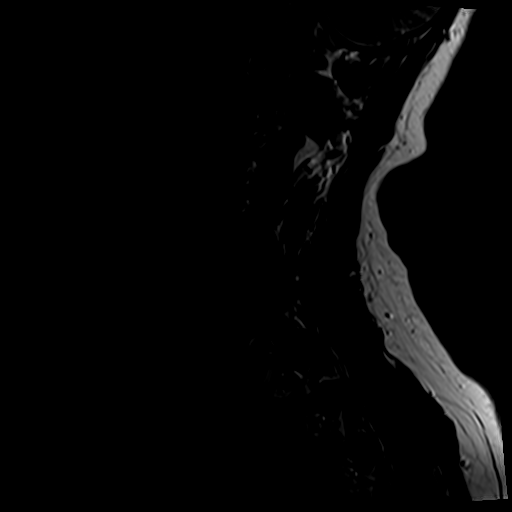

[25 of 48 positions shown; findings below may reference images not displayed]

FINDINGS: ALIGNMENT: Maintain cervical lordosis.  No malalignment.

VERTEBRAE/DISCS: Vertebral bodies are intact. Intervertebral disc
morphology's and signal are normal. Mild bright STIR signal anterior
odontoid process associated with trace atlantodental effusion. LEFT
C2-3 facets fused on degenerative basis. Striated bright T1 and
bright T2 bone marrow signal T3 compatible with hemangioma.

CORD:Cervical spinal cord is normal morphology and signal
characteristics from the cervicomedullary junction to level of T1-2,
the most caudal well visualized level.

POSTERIOR FOSSA, VERTEBRAL ARTERIES, PARASPINAL TISSUES: No MR
findings of ligamentous injury. Vertebral artery flow voids present.
Included posterior fossa and paraspinal soft tissues are normal.

DISC LEVELS:

C2-3 through C7-T1: No disc bulge, canal stenosis nor neural
foraminal narrowing. Mild cervical facet arthropathy.
IMPRESSION: Mild atlantodental osteoarthrosis with acute inflammatory changes.

Mild facet arthropathy. No canal stenosis or neural foraminal
narrowing.

## 2018-07-17 DIAGNOSIS — Z6827 Body mass index (BMI) 27.0-27.9, adult: Secondary | ICD-10-CM | POA: Diagnosis not present

## 2018-07-17 DIAGNOSIS — L658 Other specified nonscarring hair loss: Secondary | ICD-10-CM | POA: Diagnosis not present

## 2018-07-17 DIAGNOSIS — Z0000 Encounter for general adult medical examination without abnormal findings: Secondary | ICD-10-CM | POA: Diagnosis not present

## 2019-01-06 DIAGNOSIS — I10 Essential (primary) hypertension: Secondary | ICD-10-CM | POA: Diagnosis not present

## 2019-01-06 DIAGNOSIS — G43009 Migraine without aura, not intractable, without status migrainosus: Secondary | ICD-10-CM | POA: Diagnosis not present

## 2019-01-06 DIAGNOSIS — E034 Atrophy of thyroid (acquired): Secondary | ICD-10-CM | POA: Diagnosis not present

## 2019-01-06 DIAGNOSIS — I498 Other specified cardiac arrhythmias: Secondary | ICD-10-CM | POA: Diagnosis not present

## 2019-02-15 DIAGNOSIS — H6011 Cellulitis of right external ear: Secondary | ICD-10-CM | POA: Diagnosis not present

## 2019-06-22 DIAGNOSIS — S80912A Unspecified superficial injury of left knee, initial encounter: Secondary | ICD-10-CM | POA: Diagnosis not present

## 2019-06-23 ENCOUNTER — Encounter: Payer: Self-pay | Admitting: Physician Assistant

## 2019-06-23 ENCOUNTER — Other Ambulatory Visit: Payer: Self-pay

## 2019-06-23 ENCOUNTER — Ambulatory Visit (INDEPENDENT_AMBULATORY_CARE_PROVIDER_SITE_OTHER): Payer: PPO | Admitting: Physician Assistant

## 2019-06-23 VITALS — BP 138/82 | HR 72 | Temp 98.8°F | Ht 64.0 in | Wt 164.0 lb

## 2019-06-23 DIAGNOSIS — M25562 Pain in left knee: Secondary | ICD-10-CM | POA: Diagnosis not present

## 2019-06-23 DIAGNOSIS — M25362 Other instability, left knee: Secondary | ICD-10-CM | POA: Insufficient documentation

## 2019-06-23 NOTE — Assessment & Plan Note (Signed)
Recommend ice/elevation May use tramadol as needed (that urgent care gave her) Will schedule with ortho (Dr Veverly Fells) and MRI

## 2019-06-23 NOTE — Progress Notes (Signed)
Acute Office Visit  Subjective:    Patient ID: Erika Shepherd, female    DOB: 05-26-1950, 69 y.o.   MRN: [id number]  Chief Complaint  Patient presents with  . Knee Pain    Left knee pain     HPI Patient is in today for LEFT KNEE PAIN PT STATES ABOUT 2 WEEKS AGO SHE WAS WORKING OUT ON AN ELLIPTICAL MACHINE AND FELT A PULL IN HER LEFT KNEE - SINCE THAT TIME SHE HAS HAD SOME PAIN IN HER KNEE AND HAS BEEN LIMPING - YESTERDAY SHE WAS WALKING INTO HER BATHROOM AND SLIPPED - SHE FELT A POP IN HER KNEE AND HER KNEE GAVE WAY WITH HER ENDING UP IN FLOOR - SINCE THAT TIME SHE HAS NOT BEEN ABLE TO PUT ANY WEIGHT ON HER LEFT KNEE SHE WAS SEEN AT URGENT CARE YESTERDAY AND HAD A NORMAL XRAY OF LEFT KNEE WAS GIVEN TRAMADOL AND TOLD TO FOLLOW UP HERE TODAY  Past Medical History:  Diagnosis Date  . Atrophy of thyroid (acquired)   . Cellulitis of right external ear   . Essential (primary) hypertension   . Migraine without aura, not intractable, without status migrainosus   . Mild intermittent asthma, uncomplicated   . Other specified cardiac arrhythmias     Past Surgical History:  Procedure Laterality Date  . ABDOMINAL HYSTERECTOMY    . CHOLECYSTECTOMY    . right shoulder surgery  2006    Family History  Problem Relation Age of Onset  . Ovarian cancer Mother   . Hypertension Mother   . Heart attack Father   . Diabetes type II Father   . Asthma Brother     Social History   Socioeconomic History  . Marital status: Married    Spouse name: Not on file  . Number of children: 2  . Years of education: Not on file  . Highest education level: Not on file  Occupational History  . 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 Smoking status: Never Smoker  . Smokeless tobacco: Never Used  Substance and Sexual Activity  . Alcohol use: Never  . Drug use: Never  . Sexual activity: Not on file  Other Topics Concern  . Not on file  Social History Narrative  . Not on file    Social Determinants of Health   Financial Resource Strain:   . Difficulty of Paying Living Expenses:   Food Insecurity:   . Worried About Charity fundraiser in the Last Year:   . Arboriculturist in the Last Year:   Transportation Needs:   . Film/video editor (Medical):   Marland Kitchen Lack of Transportation (Non-Medical):   Physical Activity:   . Days of Exercise per Week:   . Minutes of Exercise per Session:   Stress:   . Feeling of Stress :   Social Connections:   . Frequency of Communication with Friends and Family:   . Frequency of Social Gatherings with Friends and Family:   . Attends Religious Services:   . Active Member of Clubs or Organizations:   . Attends Archivist Meetings:   Marland Kitchen Marital Status:   Intimate Partner Violence:   . Fear of Current or Ex-Partner:   . Emotionally Abused:   Marland Kitchen Physically Abused:   . Sexually Abused:      Current Outpatient Medications:  .  acebutolol (SECTRAL) 200 MG capsule, Take 200 mg by mouth daily. Patient states she takes 1  tablet on Monday, Wednesday, Friday, Disp: , Rfl:  .  betamethasone dipropionate 0.05 % cream, Apply thin layer to affected area every day for 6 weeks then every other day for 6 weeks then 2-3 times weekly PRN., Disp: , Rfl:  .  Cholecalciferol (VITAMIN D3) 2000 units TABS, Take 1 capsule by mouth daily., Disp: , Rfl:  .  levothyroxine (SYNTHROID) 25 MCG tablet, Take 25 mcg by mouth daily., Disp: , Rfl:  .  meloxicam (MOBIC) 7.5 MG tablet, Take 7.5 mg by mouth in the morning and at bedtime., Disp: , Rfl:  .  potassium chloride (MICRO-K) 10 MEQ CR capsule, Take 10 mEq by mouth daily., Disp: , Rfl:  .  spironolactone (ALDACTONE) 25 MG tablet, Take 25 mg by mouth daily. Patient states she takes 1/2 tablet Tuesday, Thursday, Saturday, Sunday, Disp: , Rfl:  .  traMADol (ULTRAM) 50 MG tablet, , Disp: , Rfl:    Allergies  Allergen Reactions  . Diovan [Valsartan]   . Labetalol Hcl   . Lisinopril   . Toprol Xl  [Metoprolol]   . Ibuprofen Palpitations  . Sulfamethoxazole Palpitations    CONSTITUTIONAL: Negative for chills, fatigue, fever, unintentional weight gain and unintentional weight loss.   CARDIOVASCULAR: Negative for chest pain, dizziness, palpitations and pedal edema.  RESPIRATORY: Negative for recent cough and dyspnea.   MSK: SEE HPI INTEGUMENTARY: Negative for rash.          Objective:    PHYSICAL EXAM:   VS: BP 138/82 (BP Location: Right Arm, Patient Position: Sitting)   Pulse 72   Temp 98.8 F (37.1 C) (Temporal)   Ht 5\' 4"  (1.626 m)   Wt 164 lb (74.4 kg)   SpO2 96%   BMI 28.15 kg/m   GEN: Well nourished, well developed, in no acute distress - IS IN [facility]  Cardiac: RRR; no murmurs, rubs, or gallops,no edema - no significant varicosities Respiratory:  normal respiratory rate and pattern with no distress - normal breath sounds with no rales, rhonchi, wheezes or rubs MS: no deformity or atrophy - HOWEVER PT HAS PAIN WITH FULL EXTENSION OF LEFT LEG AND WITH BENDING - INTERNAL AND EXTERNAL ROTATION WITH MINIMAL DISCOMFORT - KNEE INSTABILITY NOTED WITH  FURTHER MANIPULATION --- PT NOT ABLE TO BEAR WEIGHT  Psych: euthymic mood, appropriate affect and demeanor   Wt Readings from Last 3 Encounters:  06/23/19 164 lb (74.4 kg)    Health Maintenance Due  Topic Date Due  . Hepatitis C Screening  Never done  . COVID-19 Vaccine (1) Never done  . TETANUS/TDAP  Never done  . COLONOSCOPY  01/28/2013    There are no preventive care reminders to display for this patient.        Assessment & Plan:   Problem List Items Addressed This Visit      Other   Acute pain of left knee - Primary    Recommend ice/elevation May use tramadol as needed (that urgent care gave her) Will schedule with ortho (Dr Veverly Fells) and MRI          No orders of the defined types were placed in this encounter.    SARA R DAVIS, PA-C

## 2019-06-23 NOTE — Assessment & Plan Note (Signed)
WILL ORDER MRI AND APPT WITH ORTHO

## 2019-06-30 ENCOUNTER — Encounter: Payer: Self-pay | Admitting: Physician Assistant

## 2019-06-30 DIAGNOSIS — M25362 Other instability, left knee: Secondary | ICD-10-CM | POA: Diagnosis not present

## 2019-06-30 DIAGNOSIS — M25562 Pain in left knee: Secondary | ICD-10-CM | POA: Diagnosis not present

## 2019-07-07 ENCOUNTER — Ambulatory Visit: Payer: Self-pay | Admitting: Family Medicine

## 2019-07-13 DIAGNOSIS — M25562 Pain in left knee: Secondary | ICD-10-CM | POA: Diagnosis not present

## 2019-07-13 DIAGNOSIS — S83232A Complex tear of medial meniscus, current injury, left knee, initial encounter: Secondary | ICD-10-CM | POA: Diagnosis not present

## 2019-07-21 DIAGNOSIS — S83262A Peripheral tear of lateral meniscus, current injury, left knee, initial encounter: Secondary | ICD-10-CM | POA: Diagnosis not present

## 2019-07-21 DIAGNOSIS — S83232A Complex tear of medial meniscus, current injury, left knee, initial encounter: Secondary | ICD-10-CM | POA: Diagnosis not present

## 2019-07-21 DIAGNOSIS — G8918 Other acute postprocedural pain: Secondary | ICD-10-CM | POA: Diagnosis not present

## 2019-07-21 DIAGNOSIS — M94262 Chondromalacia, left knee: Secondary | ICD-10-CM | POA: Diagnosis not present

## 2019-08-19 DIAGNOSIS — M25562 Pain in left knee: Secondary | ICD-10-CM | POA: Diagnosis not present

## 2019-08-19 DIAGNOSIS — M25662 Stiffness of left knee, not elsewhere classified: Secondary | ICD-10-CM | POA: Diagnosis not present

## 2019-08-19 DIAGNOSIS — R2689 Other abnormalities of gait and mobility: Secondary | ICD-10-CM | POA: Diagnosis not present

## 2019-08-20 NOTE — Patient Instructions (Addendum)
Hypertension, Adult High blood pressure (hypertension) is when the force of blood pumping through the arteries is too strong. The arteries are the blood vessels that carry blood from the heart throughout the body. Hypertension forces the heart to work harder to pump blood and may cause arteries to become narrow or stiff. Untreated or uncontrolled hypertension can cause a heart attack, heart failure, a stroke, kidney disease, and other problems. A blood pressure reading consists of a higher number over a lower number. Ideally, your blood pressure should be below 120/80. The first ("top") number is called the systolic pressure. It is a measure of the pressure in your arteries as your heart beats. The second ("bottom") number is called the diastolic pressure. It is a measure of the pressure in your arteries as the heart relaxes. What are the causes? The exact cause of this condition is not known. There are some conditions that result in or are related to high blood pressure. What increases the risk? Some risk factors for high blood pressure are under your control. The following factors may make you more likely to develop this condition:  Smoking.  Having type 2 diabetes mellitus, high cholesterol, or both.  Not getting enough exercise or physical activity.  Being overweight.  Having too much fat, sugar, calories, or salt (sodium) in your diet.  Drinking too much alcohol. Some risk factors for high blood pressure may be difficult or impossible to change. Some of these factors include:  Having chronic kidney disease.  Having a family history of high blood pressure.  Age. Risk increases with age.  Race. You may be at higher risk if you are African American.  Gender. Men are at higher risk than women before age 42. After age 7, women are at higher risk than men.  Having obstructive sleep apnea.  Stress. What are the signs or symptoms? High blood pressure may not cause symptoms. Very  high blood pressure (hypertensive crisis) may cause:  Headache.  Anxiety.  Shortness of breath.  Nosebleed.  Nausea and vomiting.  Vision changes.  Severe chest pain.  Seizures. How is this diagnosed? This condition is diagnosed by measuring your blood pressure while you are seated, with your arm resting on a flat surface, your legs uncrossed, and your feet flat on the floor. The cuff of the blood pressure monitor will be placed directly against the skin of your upper arm at the level of your heart. It should be measured at least twice using the same arm. Certain conditions can cause a difference in blood pressure between your right and left arms. Certain factors can cause blood pressure readings to be lower or higher than normal for a short period of time:  When your blood pressure is higher when you are in a health care provider's office than when you are at home, this is called white coat hypertension. Most people with this condition do not need medicines.  When your blood pressure is higher at home than when you are in a health care provider's office, this is called masked hypertension. Most people with this condition may need medicines to control blood pressure. If you have a high blood pressure reading during one visit or you have normal blood pressure with other risk factors, you may be asked to:  Return on a different day to have your blood pressure checked again.  Monitor your blood pressure at home for 1 week or longer. If you are diagnosed with hypertension, you may have other blood  or imaging tests to help your health care provider understand your overall risk for other conditions. How is this treated? This condition is treated by making healthy lifestyle changes, such as eating healthy foods, exercising more, and reducing your alcohol intake. Your health care provider may prescribe medicine if lifestyle changes are not enough to get your blood pressure under control, and  if:  Your systolic blood pressure is above 130.  Your diastolic blood pressure is above 80. Your personal target blood pressure may vary depending on your medical conditions, your age, and other factors. Follow these instructions at home: Eating and drinking   Eat a diet that is high in fiber and potassium, and low in sodium, added sugar, and fat. An example eating plan is called the DASH (Dietary Approaches to Stop Hypertension) diet. To eat this way: ? Eat plenty of fresh fruits and vegetables. Try to fill one half of your plate at each meal with fruits and vegetables. ? Eat whole grains, such as whole-wheat pasta, brown rice, or whole-grain bread. Fill about one fourth of your plate with whole grains. ? Eat or drink low-fat dairy products, such as skim milk or low-fat yogurt. ? Avoid fatty cuts of meat, processed or cured meats, and poultry with skin. Fill about one fourth of your plate with lean proteins, such as fish, chicken without skin, beans, eggs, or tofu. ? Avoid pre-made and processed foods. These tend to be higher in sodium, added sugar, and fat.  Reduce your daily sodium intake. Most people with hypertension should eat less than 1,500 mg of sodium a day.  Do not drink alcohol if: ? Your health care provider tells you not to drink. ? You are pregnant, may be pregnant, or are planning to become pregnant.  If you drink alcohol: ? Limit how much you use to:  0-1 drink a day for women.  0-2 drinks a day for men. ? Be aware of how much alcohol is in your drink. In the [place] one drink equals one 12 oz bottle of beer (355 mL), one 5 oz glass of wine (148 mL), or one 1 oz glass of hard liquor (44 mL). Lifestyle   Work with your health care provider to maintain a healthy body weight or to lose weight. Ask what an ideal weight is for you.  Get at least 30 minutes of exercise most days of the week. Activities may include walking, swimming, or biking.  Include exercise to  strengthen your muscles (resistance exercise), such as Pilates or lifting weights, as part of your weekly exercise routine. Try to do these types of exercises for 30 minutes at least 3 days a week.  Do not use any products that contain nicotine or tobacco, such as cigarettes, e-cigarettes, and chewing tobacco. If you need help quitting, ask your health care provider.  Monitor your blood pressure at home as told by your health care provider.  Keep all follow-up visits as told by your health care provider. This is important. Medicines  Take over-the-counter and prescription medicines only as told by your health care provider. Follow directions carefully. Blood pressure medicines must be taken as prescribed.  Do not skip doses of blood pressure medicine. Doing this puts you at risk for problems and can make the medicine less effective.  Ask your health care provider about side effects or reactions to medicines that you should watch for. Contact a health care provider if you:  Think you are having a reaction to a medicine  you are taking.  Have headaches that keep coming back (recurring).  Feel dizzy.  Have swelling in your ankles.  Have trouble with your vision. Get help right away if you:  Develop a severe headache or confusion.  Have unusual weakness or numbness.  Feel faint.  Have severe pain in your chest or abdomen.  Vomit repeatedly.  Have trouble breathing. Summary  Hypertension is when the force of blood pumping through your arteries is too strong. If this condition is not controlled, it may put you at risk for serious complications.  Your personal target blood pressure may vary depending on your medical conditions, your age, and other factors. For most people, a normal blood pressure is less than 120/80.  Hypertension is treated with lifestyle changes, medicines, or a combination of both. Lifestyle changes include losing weight, eating a healthy, low-sodium diet,  exercising more, and limiting alcohol. This information is not intended to replace advice given to you by your health care provider. Make sure you discuss any questions you have with your health care provider. Document Revised: 09/24/2017 Document Reviewed: 09/24/2017 Elsevier Patient Education  Erika Shepherd.  Kegel Exercises  Kegel exercises can help strengthen your pelvic floor muscles. The pelvic floor is a group of muscles that support your rectum, small intestine, and bladder. In females, pelvic floor muscles also help support the womb (uterus). These muscles help you control the flow of urine and stool. Kegel exercises are painless and simple, and they do not require any equipment. Your provider may suggest Kegel exercises to:  Improve bladder and bowel control.  Improve sexual response.  Improve weak pelvic floor muscles after surgery to remove the uterus (hysterectomy) or pregnancy (females).  Improve weak pelvic floor muscles after prostate gland removal or surgery (males). Kegel exercises involve squeezing your pelvic floor muscles, which are the same muscles you squeeze when you try to stop the flow of urine or keep from passing gas. The exercises can be done while sitting, standing, or lying down, but it is best to vary your position. Exercises How to do Kegel exercises: 1. Squeeze your pelvic floor muscles tight. You should feel a tight lift in your rectal area. If you are a female, you should also feel a tightness in your vaginal area. Keep your stomach, buttocks, and legs relaxed. 2. Hold the muscles tight for up to 10 seconds. 3. Breathe normally. 4. Relax your muscles. 5. Repeat as told by your health care provider. Repeat this exercise daily as told by your health care provider. Continue to do this exercise for at least 4-6 weeks, or for as long as told by your health care provider. You may be referred to a physical therapist who can help you learn more about how to  do Kegel exercises. Depending on your condition, your health care provider may recommend:  Varying how long you squeeze your muscles.  Doing several sets of exercises every day.  Doing exercises for several weeks.  Making Kegel exercises a part of your regular exercise routine. This information is not intended to replace advice given to you by your health care provider. Make sure you discuss any questions you have with your health care provider. Document Revised: 09/03/2017 Document Reviewed: 09/03/2017 Elsevier Patient Education  Erika Shepherd.

## 2019-08-20 NOTE — Progress Notes (Signed)
Subjective:  Patient ID: Erika Shepherd, female    DOB: [birth year]  Age: 69 y.o. MRN: [id number]  Chief Complaint  Patient presents with  . Hypertension    HPI  Pt presents for follow up of hypertension. Patient was diagnosed years ago.. The patient is tolerating the medication well without side effects. Compliance with treatment has been good; including taking medication (acebutolol, spironolactone) as directed , maintains a healthy diet and regular exercise regimen, and following up as directed.  Hypothyroidism - Taking Synthroid daily as directed  Irregular heart rhythm. PVCs. Taking acebutolol and spironolactone.   Patient is concerned about a mole on her back.  Poor bladder control c/o that when she has the urge to go she has to go.   Current Outpatient Medications on File Prior to Visit  Medication Sig Dispense Refill  . acebutolol (SECTRAL) 200 MG capsule Take 200 mg by mouth daily. Patient states she takes 1 tablet on Monday, Wednesday, Friday    . betamethasone dipropionate 0.05 % cream Apply thin layer to affected area every day for 6 weeks then every other day for 6 weeks then 2-3 times weekly PRN.    Marland Kitchen Cholecalciferol (VITAMIN D3) 2000 units TABS Take 1 capsule by mouth daily.    Marland Kitchen levothyroxine (SYNTHROID) 25 MCG tablet Take 25 mcg by mouth daily.    . meloxicam (MOBIC) 7.5 MG tablet Take 7.5 mg by mouth in the morning and at bedtime.    . potassium chloride (MICRO-K) 10 MEQ CR capsule Take 10 mEq by mouth daily.    Marland Kitchen spironolactone (ALDACTONE) 25 MG tablet Take 25 mg by mouth daily. Patient states she takes 1/2 tablet Tuesday, Thursday, Saturday, Sunday    . traMADol (ULTRAM) 50 MG tablet      No current facility-administered medications on file prior to visit.   Past Medical History:  Diagnosis Date  . Asthma   . Atrophy of thyroid (acquired)   . Cellulitis of right external ear   . Essential (primary) hypertension   . H/O left knee surgery    Dr. Nancie Neas  .  Migraine without aura, not intractable, without status migrainosus   . Mild intermittent asthma, uncomplicated   . Other specified cardiac arrhythmias   . PVC (premature ventricular contraction)    Past Surgical History:  Procedure Laterality Date  . ABDOMINAL HYSTERECTOMY    . CHOLECYSTECTOMY    . right shoulder surgery  2006    Family History  Problem Relation Age of Onset  . Ovarian cancer Mother   . Hypertension Mother   . Heart attack Father   . Diabetes type II Father   . Asthma Brother    Social History   Socioeconomic History  . Marital status: Married    Spouse name: Not on file  . Number of children: 2  . Years of education: Not on file  . Highest education level: Not on file  Occupational History  . 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 Smoking status: Never Smoker  . Smokeless tobacco: Never Used  Vaping Use  . Vaping Use: Never used  Substance and Sexual Activity  . Alcohol use: Never  . Drug use: Never  . Sexual activity: Not on file  Other Topics Concern  . Not on file  Social History Narrative  . Not on file   Social Determinants of Health   Financial Resource Strain:   . Difficulty of Paying Living Expenses:   Food Insecurity:   .  Worried About Charity fundraiser in the Last Year:   . Arboriculturist in the Last Year:   Transportation Needs:   . Film/video editor (Medical):   Marland Kitchen Lack of Transportation (Non-Medical):   Physical Activity:   . Days of Exercise per Week:   . Minutes of Exercise per Session:   Stress:   . Feeling of Stress :   Social Connections:   . Frequency of Communication with Friends and Family:   . Frequency of Social Gatherings with Friends and Family:   . Attends Religious Services:   . Active Member of Clubs or Organizations:   . Attends Archivist Meetings:   Marland Kitchen Marital Status:     Review of Systems  Constitutional: Negative for chills, fatigue and fever.  HENT: Negative  for congestion, ear pain and sore throat.   Respiratory: Negative for cough and shortness of breath.   Cardiovascular: Negative for chest pain and palpitations.  Gastrointestinal: Negative for abdominal pain, constipation, diarrhea, nausea and vomiting.  Endocrine: Negative for polydipsia, polyphagia and polyuria.  Genitourinary: Negative for dysuria and urgency.       Poor bladder control  Musculoskeletal: Negative for arthralgias and myalgias.  Skin:       Pt is concerned about a mole on her back  Neurological: Positive for weakness. Negative for dizziness and headaches.  Psychiatric/Behavioral: Negative for dysphoric mood. The patient is not nervous/anxious.      Objective:  BP (!) 136/80   Pulse 76   Temp (!) 97.3 F (36.3 C)   Ht 5\' 4"  (1.626 m)   Wt 163 lb (73.9 kg)   SpO2 98%   BMI 27.98 kg/m   BP/Weight 08/23/2019 5/63/1497  Systolic BP 026 378  Diastolic BP 80 82  Wt. (Lbs) 163 164  BMI 27.98 28.15    Physical Exam Vitals reviewed.  Constitutional:      Appearance: Normal appearance. She is normal weight.  Cardiovascular:     Rate and Rhythm: Normal rate and regular rhythm.     Pulses: Normal pulses.     Heart sounds: Normal heart sounds.  Pulmonary:     Effort: Pulmonary effort is normal. No respiratory distress.     Breath sounds: Normal breath sounds.  Abdominal:     General: Abdomen is flat. Bowel sounds are normal.     Palpations: Abdomen is soft.     Tenderness: There is no abdominal tenderness.  Musculoskeletal:     Comments: Gait: left sided limp due to knee.  Skin:    Comments: Numerous age spots on back. Seborrheic keratosis. Sebum plugs in 2 skin lesions.   Neurological:     Mental Status: She is alert and oriented to person, place, and time.  Psychiatric:        Mood and Affect: Mood normal.        Behavior: Behavior normal.     Diabetic Foot Exam - Simple   No data filed       No results found for: WBC, HGB, HCT, PLT, GLUCOSE,  CHOL, TRIG, HDL, LDLDIRECT, LDLCALC, ALT, AST, NA, K, CL, CREATININE, BUN, CO2, TSH, PSA, INR, GLUF, HGBA1C, MICROALBUR    Assessment & Plan:   1. Essential hypertension Well controlled.  No changes to medicines.  Continue to work on eating a healthy diet and exercise.  Labs drawn today.  - Comprehensive metabolic panel - Lipid panel - CBC with Differential/Platelet  2. Acquired hypothyroidism - TSH  3.  PVC (premature ventricular contraction) The current medical regimen is effective;  continue present plan and medications. Keep appt with Dr. Bettina Gavia.   4. Mild intermittent asthma without complication Recommend albuterol inhaler for rescue.  5. Mixed stress and urge urinary incontinence  recommend kegal exercises and bladder drills. Prefers no medicines at this time.   Orders Placed This Encounter  Procedures  . Comprehensive metabolic panel  . Lipid panel  . CBC with Differential/Platelet  . TSH     Follow-up: Return in 6 months (on 02/23/2020).  An After Visit Summary was printed and given to the patient.  Rochel Brome [facility] [phone]

## 2019-08-23 ENCOUNTER — Ambulatory Visit (INDEPENDENT_AMBULATORY_CARE_PROVIDER_SITE_OTHER): Payer: PPO | Admitting: Family Medicine

## 2019-08-23 ENCOUNTER — Encounter: Payer: Self-pay | Admitting: Family Medicine

## 2019-08-23 ENCOUNTER — Other Ambulatory Visit: Payer: Self-pay

## 2019-08-23 VITALS — BP 136/80 | HR 76 | Temp 97.3°F | Ht 64.0 in | Wt 163.0 lb

## 2019-08-23 DIAGNOSIS — I10 Essential (primary) hypertension: Secondary | ICD-10-CM | POA: Diagnosis not present

## 2019-08-23 DIAGNOSIS — E039 Hypothyroidism, unspecified: Secondary | ICD-10-CM | POA: Diagnosis not present

## 2019-08-23 DIAGNOSIS — J4520 Mild intermittent asthma, uncomplicated: Secondary | ICD-10-CM | POA: Diagnosis not present

## 2019-08-23 DIAGNOSIS — M25562 Pain in left knee: Secondary | ICD-10-CM | POA: Diagnosis not present

## 2019-08-23 DIAGNOSIS — I493 Ventricular premature depolarization: Secondary | ICD-10-CM | POA: Diagnosis not present

## 2019-08-23 DIAGNOSIS — N3946 Mixed incontinence: Secondary | ICD-10-CM | POA: Insufficient documentation

## 2019-08-23 DIAGNOSIS — M25662 Stiffness of left knee, not elsewhere classified: Secondary | ICD-10-CM | POA: Diagnosis not present

## 2019-08-23 DIAGNOSIS — R2689 Other abnormalities of gait and mobility: Secondary | ICD-10-CM | POA: Diagnosis not present

## 2019-08-23 MED ORDER — ALBUTEROL SULFATE HFA 108 (90 BASE) MCG/ACT IN AERS
2.0000 | INHALATION_SPRAY | Freq: Four times a day (QID) | RESPIRATORY_TRACT | 1 refills | Status: DC | PRN
Start: 2019-08-23 — End: 2021-04-19

## 2019-08-24 LAB — COMPREHENSIVE METABOLIC PANEL
ALT: 26 IU/L (ref 0–32)
AST: 21 IU/L (ref 0–40)
Albumin/Globulin Ratio: 1.6 (ref 1.2–2.2)
Albumin: 4.4 g/dL (ref 3.8–4.8)
Alkaline Phosphatase: 116 IU/L (ref 48–121)
BUN/Creatinine Ratio: 20 (ref 12–28)
BUN: 15 mg/dL (ref 8–27)
Bilirubin Total: 0.4 mg/dL (ref 0.0–1.2)
CO2: 21 mmol/L (ref 20–29)
Calcium: 9.9 mg/dL (ref 8.7–10.3)
Chloride: 102 mmol/L (ref 96–106)
Creatinine, Ser: 0.75 mg/dL (ref 0.57–1.00)
GFR calc Af Amer: 94 mL/min/{1.73_m2} (ref >59)
GFR calc non Af Amer: 82 mL/min/{1.73_m2} (ref >59)
Globulin, Total: 2.7 g/dL (ref 1.5–4.5)
Glucose: 92 mg/dL (ref 65–99)
Potassium: 4.5 mmol/L (ref 3.5–5.2)
Sodium: 142 mmol/L (ref 134–144)
Total Protein: 7.1 g/dL (ref 6.0–8.5)

## 2019-08-24 LAB — TSH: TSH: 2.47 u[IU]/mL (ref 0.450–4.500)

## 2019-08-24 LAB — CBC WITH DIFFERENTIAL/PLATELET
Basophils Absolute: 0.1 10*3/uL (ref 0.0–0.2)
Basos: 1 %
EOS (ABSOLUTE): 0.1 10*3/uL (ref 0.0–0.4)
Eos: 2 %
Hematocrit: 43.5 % (ref 34.0–46.6)
Hemoglobin: 14.4 g/dL (ref 11.1–15.9)
Immature Grans (Abs): 0.1 10*3/uL (ref 0.0–0.1)
Immature Granulocytes: 1 %
Lymphocytes Absolute: 2.7 10*3/uL (ref 0.7–3.1)
Lymphs: 29 %
MCH: 26.8 pg (ref 26.6–33.0)
MCHC: 33.1 g/dL (ref 31.5–35.7)
MCV: 81 fL (ref 79–97)
Monocytes Absolute: 0.7 10*3/uL (ref 0.1–0.9)
Monocytes: 8 %
Neutrophils Absolute: 5.6 10*3/uL (ref 1.4–7.0)
Neutrophils: 59 %
Platelets: 309 10*3/uL (ref 150–450)
RBC: 5.37 x10E6/uL — ABNORMAL HIGH (ref 3.77–5.28)
RDW: 13.5 % (ref 11.7–15.4)
WBC: 9.2 10*3/uL (ref 3.4–10.8)

## 2019-08-24 LAB — LIPID PANEL
Chol/HDL Ratio: 3.2 ratio (ref 0.0–4.4)
Cholesterol, Total: 220 mg/dL — ABNORMAL HIGH (ref 100–199)
HDL: 69 mg/dL (ref >39)
LDL Chol Calc (NIH): 133 mg/dL — ABNORMAL HIGH (ref 0–99)
Triglycerides: 102 mg/dL (ref 0–149)
VLDL Cholesterol Cal: 18 mg/dL (ref 5–40)

## 2019-08-24 LAB — CARDIOVASCULAR RISK ASSESSMENT

## 2019-08-25 DIAGNOSIS — R2689 Other abnormalities of gait and mobility: Secondary | ICD-10-CM | POA: Diagnosis not present

## 2019-08-25 DIAGNOSIS — M25562 Pain in left knee: Secondary | ICD-10-CM | POA: Diagnosis not present

## 2019-08-25 DIAGNOSIS — M25662 Stiffness of left knee, not elsewhere classified: Secondary | ICD-10-CM | POA: Diagnosis not present

## 2019-08-30 DIAGNOSIS — R2689 Other abnormalities of gait and mobility: Secondary | ICD-10-CM | POA: Diagnosis not present

## 2019-08-30 DIAGNOSIS — M25662 Stiffness of left knee, not elsewhere classified: Secondary | ICD-10-CM | POA: Diagnosis not present

## 2019-08-30 DIAGNOSIS — M25562 Pain in left knee: Secondary | ICD-10-CM | POA: Diagnosis not present

## 2019-09-01 DIAGNOSIS — M25562 Pain in left knee: Secondary | ICD-10-CM | POA: Diagnosis not present

## 2019-09-01 DIAGNOSIS — R2689 Other abnormalities of gait and mobility: Secondary | ICD-10-CM | POA: Diagnosis not present

## 2019-09-01 DIAGNOSIS — M25662 Stiffness of left knee, not elsewhere classified: Secondary | ICD-10-CM | POA: Diagnosis not present

## 2019-09-06 DIAGNOSIS — R2689 Other abnormalities of gait and mobility: Secondary | ICD-10-CM | POA: Diagnosis not present

## 2019-09-06 DIAGNOSIS — M25562 Pain in left knee: Secondary | ICD-10-CM | POA: Diagnosis not present

## 2019-09-06 DIAGNOSIS — M25662 Stiffness of left knee, not elsewhere classified: Secondary | ICD-10-CM | POA: Diagnosis not present

## 2019-09-07 ENCOUNTER — Other Ambulatory Visit: Payer: Self-pay

## 2019-09-07 MED ORDER — ACEBUTOLOL HCL 200 MG PO CAPS: 200.0000 mg | ORAL_CAPSULE | Freq: Every day | ORAL | 5 refills | Status: DC

## 2019-09-08 DIAGNOSIS — M25662 Stiffness of left knee, not elsewhere classified: Secondary | ICD-10-CM | POA: Diagnosis not present

## 2019-09-08 DIAGNOSIS — M25562 Pain in left knee: Secondary | ICD-10-CM | POA: Diagnosis not present

## 2019-09-08 DIAGNOSIS — R2689 Other abnormalities of gait and mobility: Secondary | ICD-10-CM | POA: Diagnosis not present

## 2019-09-13 DIAGNOSIS — M25662 Stiffness of left knee, not elsewhere classified: Secondary | ICD-10-CM | POA: Diagnosis not present

## 2019-09-13 DIAGNOSIS — M25562 Pain in left knee: Secondary | ICD-10-CM | POA: Diagnosis not present

## 2019-09-13 DIAGNOSIS — R2689 Other abnormalities of gait and mobility: Secondary | ICD-10-CM | POA: Diagnosis not present

## 2019-09-15 DIAGNOSIS — R2689 Other abnormalities of gait and mobility: Secondary | ICD-10-CM | POA: Diagnosis not present

## 2019-09-15 DIAGNOSIS — M25562 Pain in left knee: Secondary | ICD-10-CM | POA: Diagnosis not present

## 2019-09-15 DIAGNOSIS — M25662 Stiffness of left knee, not elsewhere classified: Secondary | ICD-10-CM | POA: Diagnosis not present

## 2019-09-20 DIAGNOSIS — M25562 Pain in left knee: Secondary | ICD-10-CM | POA: Diagnosis not present

## 2019-09-20 DIAGNOSIS — R2689 Other abnormalities of gait and mobility: Secondary | ICD-10-CM | POA: Diagnosis not present

## 2019-09-20 DIAGNOSIS — M25662 Stiffness of left knee, not elsewhere classified: Secondary | ICD-10-CM | POA: Diagnosis not present

## 2019-09-21 ENCOUNTER — Ambulatory Visit: Payer: PPO

## 2019-09-22 DIAGNOSIS — M25562 Pain in left knee: Secondary | ICD-10-CM | POA: Diagnosis not present

## 2019-09-22 DIAGNOSIS — R2689 Other abnormalities of gait and mobility: Secondary | ICD-10-CM | POA: Diagnosis not present

## 2019-09-22 DIAGNOSIS — M25662 Stiffness of left knee, not elsewhere classified: Secondary | ICD-10-CM | POA: Diagnosis not present

## 2019-09-27 DIAGNOSIS — M25662 Stiffness of left knee, not elsewhere classified: Secondary | ICD-10-CM | POA: Diagnosis not present

## 2019-09-27 DIAGNOSIS — M25562 Pain in left knee: Secondary | ICD-10-CM | POA: Diagnosis not present

## 2019-09-27 DIAGNOSIS — R2689 Other abnormalities of gait and mobility: Secondary | ICD-10-CM | POA: Diagnosis not present

## 2019-09-29 DIAGNOSIS — R2689 Other abnormalities of gait and mobility: Secondary | ICD-10-CM | POA: Diagnosis not present

## 2019-09-29 DIAGNOSIS — M25562 Pain in left knee: Secondary | ICD-10-CM | POA: Diagnosis not present

## 2019-09-29 DIAGNOSIS — M25662 Stiffness of left knee, not elsewhere classified: Secondary | ICD-10-CM | POA: Diagnosis not present

## 2019-10-06 DIAGNOSIS — R2689 Other abnormalities of gait and mobility: Secondary | ICD-10-CM | POA: Diagnosis not present

## 2019-10-06 DIAGNOSIS — M25562 Pain in left knee: Secondary | ICD-10-CM | POA: Diagnosis not present

## 2019-10-06 DIAGNOSIS — M25662 Stiffness of left knee, not elsewhere classified: Secondary | ICD-10-CM | POA: Diagnosis not present

## 2019-10-11 DIAGNOSIS — M25562 Pain in left knee: Secondary | ICD-10-CM | POA: Diagnosis not present

## 2019-10-11 DIAGNOSIS — R2689 Other abnormalities of gait and mobility: Secondary | ICD-10-CM | POA: Diagnosis not present

## 2019-10-11 DIAGNOSIS — M25662 Stiffness of left knee, not elsewhere classified: Secondary | ICD-10-CM | POA: Diagnosis not present

## 2019-10-13 DIAGNOSIS — M25562 Pain in left knee: Secondary | ICD-10-CM | POA: Diagnosis not present

## 2019-10-13 DIAGNOSIS — R2689 Other abnormalities of gait and mobility: Secondary | ICD-10-CM | POA: Diagnosis not present

## 2019-10-13 DIAGNOSIS — M25662 Stiffness of left knee, not elsewhere classified: Secondary | ICD-10-CM | POA: Diagnosis not present

## 2019-11-08 ENCOUNTER — Other Ambulatory Visit: Payer: Self-pay

## 2019-11-08 MED ORDER — POTASSIUM CHLORIDE ER 10 MEQ PO CPCR
10.0000 meq | ORAL_CAPSULE | Freq: Every day | ORAL | 0 refills | Status: DC
Start: 2019-11-08 — End: 2020-02-07

## 2019-12-20 ENCOUNTER — Other Ambulatory Visit: Payer: Self-pay

## 2019-12-20 MED ORDER — SPIRONOLACTONE 25 MG PO TABS: 25.0000 mg | ORAL_TABLET | Freq: Every day | ORAL | 0 refills | Status: DC

## 2020-01-07 ENCOUNTER — Ambulatory Visit (INDEPENDENT_AMBULATORY_CARE_PROVIDER_SITE_OTHER): Payer: PPO | Admitting: Nurse Practitioner

## 2020-01-07 ENCOUNTER — Encounter: Payer: Self-pay | Admitting: Nurse Practitioner

## 2020-01-07 ENCOUNTER — Other Ambulatory Visit: Payer: Self-pay

## 2020-01-07 VITALS — BP 130/70 | HR 70 | Temp 96.4°F | Ht 64.0 in | Wt 167.0 lb

## 2020-01-07 DIAGNOSIS — N3000 Acute cystitis without hematuria: Secondary | ICD-10-CM

## 2020-01-07 DIAGNOSIS — R300 Dysuria: Secondary | ICD-10-CM

## 2020-01-07 LAB — POCT URINALYSIS DIPSTICK
Bilirubin, UA: NEGATIVE
Blood, UA: NEGATIVE
Glucose, UA: NEGATIVE
Ketones, UA: NEGATIVE
Nitrite, UA: NEGATIVE
Protein, UA: NEGATIVE
Spec Grav, UA: 1.025 (ref 1.010–1.025)
Urobilinogen, UA: 0.2 E.U./dL
pH, UA: 5.5 (ref 5.0–8.0)

## 2020-01-07 MED ORDER — CIPROFLOXACIN HCL 250 MG PO TABS: 250.0000 mg | ORAL_TABLET | Freq: Two times a day (BID) | ORAL | 0 refills | Status: AC

## 2020-01-07 NOTE — Progress Notes (Signed)
Acute Office Visit  Subjective:    Patient ID: Erika Shepherd, female    DOB: 04-07-1950, 69 y.o.   MRN: [id number]  Chief Complaint  Patient presents with  . Urinary Tract Infection    HPI Patient is in today for UTI symptoms which have come and gone for the past 2-3 weeks. Reports having urgency and frequency to urinate, also complains of mild back pain.She denies fever, chills,or body aches. Treatment has been pushing fluids. Has not tried any over the counter medication. She has a past medical history of hypertension, hypothyroidism, and asthma. She denies any recent illnesses or antibiotic therapy.   Past Medical History:  Diagnosis Date  . Asthma   . Atrophy of thyroid (acquired)   . Cellulitis of right external ear   . Essential (primary) hypertension   . H/O left knee surgery    Dr. Nancie Neas  . Migraine without aura, not intractable, without status migrainosus   . Mild intermittent asthma, uncomplicated   . Other specified cardiac arrhythmias   . PVC (premature ventricular contraction)     Past Surgical History:  Procedure Laterality Date  . ABDOMINAL HYSTERECTOMY    . CHOLECYSTECTOMY    . right shoulder surgery  2006    Family History  Problem Relation Age of Onset  . Ovarian cancer Mother   . Hypertension Mother   . Heart attack Father   . Diabetes type II Father   . Asthma Brother     Social History   Socioeconomic History  . Marital status: Married    Spouse name: Not on file  . Number of children: 2  . Years of education: Not on file  . Highest education level: Not on file  Occupational History  . 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 Smoking status: Never Smoker  . Smokeless tobacco: Never Used  Vaping Use  . Vaping Use: Never used  Substance and Sexual Activity  . Alcohol use: Never  . Drug use: Never  . Sexual activity: Not on file  Other Topics Concern  . Not on file  Social History Narrative  . Not on file    Social Determinants of Health   Financial Resource Strain: Not on file  Food Insecurity: Not on file  Transportation Needs: Not on file  Physical Activity: Not on file  Stress: Not on file  Social Connections: Not on file  Intimate Partner Violence: Not on file    Outpatient Medications Prior to Visit  Medication Sig Dispense Refill  . acebutolol (SECTRAL) 200 MG capsule Take 1 capsule (200 mg total) by mouth daily. Patient states she takes 1 tablet on Monday, Wednesday, Friday 45 capsule 5  . albuterol (VENTOLIN HFA) 108 (90 Base) MCG/ACT inhaler Inhale 2 puffs into the lungs every 6 (six) hours as needed for wheezing or shortness of breath. 18 g 1  . betamethasone dipropionate 0.05 % cream Apply thin layer to affected area every day for 6 weeks then every other day for 6 weeks then 2-3 times weekly PRN.    Marland Kitchen Cholecalciferol (VITAMIN D3) 2000 units TABS Take 1 capsule by mouth daily.    Marland Kitchen levothyroxine (SYNTHROID) 25 MCG tablet Take 25 mcg by mouth daily.    . meloxicam (MOBIC) 7.5 MG tablet Take 7.5 mg by mouth in the morning and at bedtime.    . potassium chloride (MICRO-K) 10 MEQ CR capsule Take 1 capsule (10 mEq total) by mouth daily. 90 capsule 0  .  spironolactone (ALDACTONE) 25 MG tablet Take 1 tablet (25 mg total) by mouth daily. Patient states she takes 1/2 tablet Tuesday, Thursday, Saturday, Sunday 45 tablet 0  . traMADol (ULTRAM) 50 MG tablet      No facility-administered medications prior to visit.    Allergies  Allergen Reactions  . Diovan [Valsartan]   . Labetalol Hcl   . Lisinopril   . Toprol Xl [Metoprolol]   . Ibuprofen Palpitations  . Sulfamethoxazole Palpitations    Review of Systems  Constitutional: Negative for chills, fatigue and fever.  HENT: Negative for congestion, ear pain, postnasal drip, rhinorrhea, sinus pressure, sinus pain and sore throat.   Respiratory: Negative for cough, shortness of breath and wheezing.   Cardiovascular: Negative for  chest pain.  Gastrointestinal: Negative for diarrhea and nausea.  Genitourinary: Positive for dysuria, frequency and urgency. Negative for flank pain, hematuria and pelvic pain.  Musculoskeletal: Positive for back pain (generalized lower back).  Skin: Negative.   Neurological: Negative for dizziness and headaches.       Objective:    Physical Exam Vitals reviewed.  Constitutional:      Appearance: Normal appearance.  HENT:     Head: Normocephalic.  Cardiovascular:     Rate and Rhythm: Normal rate and regular rhythm.     Pulses: Normal pulses.     Heart sounds: Normal heart sounds.  Pulmonary:     Effort: Pulmonary effort is normal.     Breath sounds: Normal breath sounds.  Abdominal:     General: Bowel sounds are normal. There is no distension.     Palpations: Abdomen is soft. There is no mass.     Tenderness: There is no abdominal tenderness. There is no right CVA tenderness, left CVA tenderness, guarding or rebound.  Skin:    General: Skin is warm and dry.     Capillary Refill: Capillary refill takes less than 2 seconds.  Neurological:     General: No focal deficit present.     Mental Status: She is alert and oriented to person, place, and time.  Psychiatric:        Mood and Affect: Mood normal.        Behavior: Behavior normal.     There were no vitals taken for this visit. Wt Readings from Last 3 Encounters:  08/23/19 163 lb (73.9 kg)  06/23/19 164 lb (74.4 kg)    Health Maintenance Due  Topic Date Due  . Hepatitis C Screening  Never done  . COVID-19 Vaccine (1) Never done  . COLONOSCOPY  01/28/2013  . INFLUENZA VACCINE  Never done    There are no preventive care reminders to display for this patient.   Lab Results  Component Value Date   TSH 2.470 08/23/2019   Lab Results  Component Value Date   WBC 9.2 08/23/2019   HGB 14.4 08/23/2019   HCT 43.5 08/23/2019   MCV 81 08/23/2019   PLT 309 08/23/2019   Lab Results  Component Value Date   NA 142  08/23/2019   K 4.5 08/23/2019   CO2 21 08/23/2019   GLUCOSE 92 08/23/2019   BUN 15 08/23/2019   CREATININE 0.75 08/23/2019   BILITOT 0.4 08/23/2019   ALKPHOS 116 08/23/2019   AST 21 08/23/2019   ALT 26 08/23/2019   PROT 7.1 08/23/2019   ALBUMIN 4.4 08/23/2019   CALCIUM 9.9 08/23/2019   Lab Results  Component Value Date   CHOL 220 (H) 08/23/2019   Lab Results  Component  Value Date   HDL 69 08/23/2019   Lab Results  Component Value Date   LDLCALC 133 (H) 08/23/2019   Lab Results  Component Value Date   TRIG 102 08/23/2019   Lab Results  Component Value Date   CHOLHDL 3.2 08/23/2019   No results found for: HGBA1C     Assessment & Plan:  1. Acute cystitis without hematuria - POCT urinalysis dipstick - Urine Culture   Begin Cipro twice daily for UTI for 5 days Drink plenty of fluids Call office or after hours phone for worsening of symptoms or side effects of medication Avoid holding urine    I  Follow-up: PRN if symptoms worsen or fail to improve  An After Visit Summary was printed and given to the patient.  Rip Harbour, NP [place] [phone]

## 2020-01-07 NOTE — Patient Instructions (Signed)
Begin Cipro twice daily for UTI for 5 days Drink plenty of fluids Call office or after hours phone for worsening of symptoms or side effects of medication Avoid holding urine  Urinary Tract Infection, Adult A urinary tract infection (UTI) is an infection of any part of the urinary tract. The urinary tract includes:  The kidneys.  The ureters.  The bladder.  The urethra. These organs make, store, and get rid of pee (urine) in the body. What are the causes? This is caused by germs (bacteria) in your genital area. These germs grow and cause swelling (inflammation) of your urinary tract. What increases the risk? You are more likely to develop this condition if:  You have a small, thin tube (catheter) to drain pee.  You cannot control when you pee or poop (incontinence).  You are female, and: ? You use these methods to prevent pregnancy:  A medicine that kills sperm (spermicide).  A device that blocks sperm (diaphragm). ? You have low levels of a female hormone (estrogen). ? You are pregnant.  You have genes that add to your risk.  You are sexually active.  You take antibiotic medicines.  You have trouble peeing because of: ? A prostate that is bigger than normal, if you are female. ? A blockage in the part of your body that drains pee from the bladder (urethra). ? A kidney stone. ? A nerve condition that affects your bladder (neurogenic bladder). ? Not getting enough to drink. ? Not peeing often enough.  You have other conditions, such as: ? Diabetes. ? A weak disease-fighting system (immune system). ? Sickle cell disease. ? Gout. ? Injury of the spine. What are the signs or symptoms? Symptoms of this condition include:  Needing to pee right away (urgently).  Peeing often.  Peeing small amounts often.  Pain or burning when peeing.  Blood in the pee.  Pee that smells bad or not like normal.  Trouble peeing.  Pee that is cloudy.  Fluid coming from  the vagina, if you are female.  Pain in the belly or lower back. Other symptoms include:  Throwing up (vomiting).  No urge to eat.  Feeling mixed up (confused).  Being tired and grouchy (irritable).  A fever.  Watery poop (diarrhea). How is this treated? This condition may be treated with:  Antibiotic medicine.  Other medicines.  Drinking enough water. Follow these instructions at home:  Medicines  Take over-the-counter and prescription medicines only as told by your doctor.  If you were prescribed an antibiotic medicine, take it as told by your doctor. Do not stop taking it even if you start to feel better. General instructions  Make sure you: ? Pee until your bladder is empty. ? Do not hold pee for a long time. ? Empty your bladder after sex. ? Wipe from front to back after pooping if you are a female. Use each tissue one time when you wipe.  Drink enough fluid to keep your pee pale yellow.  Keep all follow-up visits as told by your doctor. This is important. Contact a doctor if:  You do not get better after 1-2 days.  Your symptoms go away and then come back. Get help right away if:  You have very bad back pain.  You have very bad pain in your lower belly.  You have a fever.  You are sick to your stomach (nauseous).  You are throwing up. Summary  A urinary tract infection (UTI) is an infection of  any part of the urinary tract.  This condition is caused by germs in your genital area.  There are many risk factors for a UTI. These include having a small, thin tube to drain pee and not being able to control when you pee or poop.  Treatment includes antibiotic medicines for germs.  Drink enough fluid to keep your pee pale yellow. This information is not intended to replace advice given to you by your health care provider. Make sure you discuss any questions you have with your health care provider. Document Revised: 01/01/2018 Document Reviewed:  07/24/2017 Elsevier Patient Education  [place]. Ciprofloxacin tablets What is this medicine? CIPROFLOXACIN (sip roe FLOX a sin) is a quinolone antibiotic. It is used to treat certain kinds of bacterial infections. It will not work for colds, flu, or other viral infections. This medicine may be used for other purposes; ask your health care provider or pharmacist if you have questions. COMMON BRAND NAME(S): Cipro What should I tell my health care provider before I take this medicine? They need to know if you have any of these conditions:  bone problems  diabetes  heart disease  high blood pressure  history of irregular heartbeat  history of low levels of potassium in the blood  joint problems  kidney disease  liver disease  mental illness  myasthenia gravis  seizures  tendon problems  tingling of the fingers or toes, or other nerve disorder  an unusual or allergic reaction to ciprofloxacin, other antibiotics or medicines, foods, dyes, or preservatives  pregnant or trying to get pregnant  breast-feeding How should I use this medicine? Take this medicine by mouth with a full glass of water. Follow the directions on the prescription label. You can take it with or without food. If it upsets your stomach, take it with food. Take your medicine at regular intervals. Do not take your medicine more often than directed. Take all of your medicine as directed even if you think you are better. Do not skip doses or stop your medicine early. Avoid antacids, aluminum, calcium, iron, magnesium, and zinc products for 6 hours before and 2 hours after taking a dose of this medicine. A special MedGuide will be given to you by the pharmacist with each prescription and refill. Be sure to read this information carefully each time. Talk to your pediatrician regarding the use of this medicine in children. Special care may be needed. Overdosage: If you think you have taken too much of  this medicine contact a poison control center or emergency room at once. NOTE: This medicine is only for you. Do not share this medicine with others. What if I miss a dose? If you miss a dose, take it as soon as you can. If it is almost time for your next dose, take only that dose. Do not take double or extra doses. What may interact with this medicine? Do not take this medicine with any of the following medications:  cisapride  dronedarone  flibanserin  lomitapide  pimozide  thioridazine  tizanidine This medicine may also interact with the following medications:  antacids  birth control pills  caffeine  certain medicines for diabetes, like glipizide, glyburide, or insulin  certain medicines that treat or prevent blood clots like warfarin  clozapine  cyclosporine  didanosine buffered tablets or powder  dofetilide  duloxetine  lanthanum carbonate  lidocaine  methotrexate  multivitamins  NSAIDS, medicines for pain and inflammation, like ibuprofen or naproxen  olanzapine  omeprazole  other medicines that prolong the QT interval (cause an abnormal heart rhythm)  phenytoin  probenecid  ropinirole  sevelamer  sildenafil  sucralfate  theophylline  ziprasidone  zolpidem This list may not describe all possible interactions. Give your health care provider a list of all the medicines, herbs, non-prescription drugs, or dietary supplements you use. Also tell them if you smoke, drink alcohol, or use illegal drugs. Some items may interact with your medicine. What should I watch for while using this medicine? Tell your doctor or health care provider if your symptoms do not start to get better or if they get worse. This medicine may cause serious skin reactions. They can happen weeks to months after starting the medicine. Contact your health care provider right away if you notice fevers or flu-like symptoms with a rash. The rash may be red or purple and  then turn into blisters or peeling of the skin. Or, you might notice a red rash with swelling of the face, lips or lymph nodes in your neck or under your arms. Do not treat diarrhea with over the counter products. Contact your doctor if you have diarrhea that lasts more than 2 days or if it is severe and watery. Check with your doctor or health care provider if you get an attack of severe diarrhea, nausea and vomiting, or if you sweat a lot. The loss of too much body fluid can make it dangerous for you to take this medicine. This medicine may increase blood sugar. Ask your health care provider if changes in diet or medicines are needed if you have diabetes. You may get drowsy or dizzy. Do not drive, use machinery, or do anything that needs mental alertness until you know how this medicine affects you. Do not sit or stand up quickly, especially if you are an older patient. This reduces the risk of dizzy or fainting spells. This medicine can make you more sensitive to the sun. Keep out of the sun. If you cannot avoid being in the sun, wear protective clothing and use sunscreen. Do not use sun lamps or tanning beds/booths. What side effects may I notice from receiving this medicine? Side effects that you should report to your doctor or health care professional as soon as possible:  allergic reactions like skin rash or hives, swelling of the face, lips, or tongue  anxious  bloody or watery diarrhea  confusion  depressed mood  fast, irregular heartbeat  fever  hallucination, loss of contact with reality  joint, muscle, or tendon pain or swelling  loss of memory  pain, tingling, numbness in the hands or feet  redness, blistering, peeling or loosening of the skin, including inside the mouth  seizures  signs and symptoms of aortic dissection such as sudden chest, stomach, or back pain  signs and symptoms of high blood sugar such as being more thirsty or hungry or having to urinate more  than normal. You may also feel very tired or have blurry vision.  signs and symptoms of liver injury like dark yellow or brown urine; general ill feeling or flu-like symptoms; light-colored stools; loss of appetite; nausea; right upper belly pain; unusually weak or tired; yellowing of the eyes or skin  signs and symptoms of low blood sugar such as feeling anxious; confusion; dizziness; increased hunger; unusually weak or tired; sweating; shakiness; cold; irritable; headache; blurred vision; fast heartbeat; loss of consciousness; pale skin  suicidal thoughts or other mood changes  sunburn  unusually weak or tired Side  effects that usually do not require medical attention (report to your doctor or health care professional if they continue or are bothersome):  dry mouth  headache  nausea  trouble sleeping This list may not describe all possible side effects. Call your doctor for medical advice about side effects. You may report side effects to FDA at 1-800-FDA-1088. Where should I keep my medicine? Keep out of the reach of children. Store at room temperature below 30 degrees C (86 degrees F). Keep container tightly closed. Throw away any unused medicine after the expiration date. NOTE: This sheet is a summary. It may not cover all possible information. If you have questions about this medicine, talk to your doctor, pharmacist, or health care provider.  2020 Elsevier/Gold Standard (2018-04-16 11:26:08)

## 2020-01-11 LAB — URINE CULTURE

## 2020-02-07 ENCOUNTER — Other Ambulatory Visit: Payer: Self-pay | Admitting: Family Medicine

## 2020-02-07 MED ORDER — LEVOTHYROXINE SODIUM 25 MCG PO TABS: 25.0000 ug | ORAL_TABLET | Freq: Every day | ORAL | 3 refills | Status: DC

## 2020-02-23 ENCOUNTER — Ambulatory Visit (INDEPENDENT_AMBULATORY_CARE_PROVIDER_SITE_OTHER): Payer: PPO | Admitting: Family Medicine

## 2020-02-23 ENCOUNTER — Other Ambulatory Visit: Payer: Self-pay

## 2020-02-23 ENCOUNTER — Encounter: Payer: Self-pay | Admitting: Family Medicine

## 2020-02-23 VITALS — BP 120/80 | HR 76 | Temp 97.3°F | Ht 64.0 in | Wt 165.0 lb

## 2020-02-23 DIAGNOSIS — H6123 Impacted cerumen, bilateral: Secondary | ICD-10-CM

## 2020-02-23 DIAGNOSIS — E782 Mixed hyperlipidemia: Secondary | ICD-10-CM

## 2020-02-23 DIAGNOSIS — N3946 Mixed incontinence: Secondary | ICD-10-CM

## 2020-02-23 DIAGNOSIS — E039 Hypothyroidism, unspecified: Secondary | ICD-10-CM | POA: Diagnosis not present

## 2020-02-23 DIAGNOSIS — I10 Essential (primary) hypertension: Secondary | ICD-10-CM

## 2020-02-23 DIAGNOSIS — R42 Dizziness and giddiness: Secondary | ICD-10-CM | POA: Diagnosis not present

## 2020-02-23 DIAGNOSIS — J4520 Mild intermittent asthma, uncomplicated: Secondary | ICD-10-CM

## 2020-02-23 DIAGNOSIS — I493 Ventricular premature depolarization: Secondary | ICD-10-CM | POA: Diagnosis not present

## 2020-02-23 MED ORDER — ACEBUTOLOL HCL 200 MG PO CAPS: 200.0000 mg | ORAL_CAPSULE | Freq: Every day | ORAL | 3 refills | Status: DC

## 2020-02-23 NOTE — Patient Instructions (Signed)
Recommend get some over-the-counter Debrox.  This is for wax buildup in your ears.  Recommend follow directions for 1 week.  If continues to have any issues we can bring you back for nurse visit to irrigate your ears.  No changes to your other medications.  I do recommend Covid vaccinations.  Please continue to practice wearing your mask and maintain a 6 foot distance as well as washing hands consistently as to decrease possibility of Covid infections.

## 2020-02-23 NOTE — Progress Notes (Signed)
Subjective:  Patient ID: Erika [facility], female    DOB: [birth year]  Age: 70 y.o. MRN: [id number]  Chief Complaint  Patient presents with  . Hypertension  . Hypothyroidism    HPI Hypothyroidism-Synthroid 25 mcg once daily. Hypertension-Spironolactone 25 mg half tablet Tuesday, Thursday, Saturday, Sunday.  Again this was established by Dr. Bettina Gavia and has done well with it. PVCs: On ACE fetal I will 200 mg Monday, Wednesday, Friday.  This was established by cardiology and has done well with this. Bladder incontinence is unchanged.  She is done Kegel exercise as well as bladder drills.  She does not wish to have any medication she will just wear panty liner. Asthma rarely if ever has to use albuterol inhaler. Patient had a 3-day episode where she felt dizzy and her right ear bothered her.  This was a couple of weeks ago.  This has resolved.  She has had no falls.  At the time she was raising up from her bed when this began.  She denies having increased dizziness with turning her head in bed.  Current Outpatient Medications on File Prior to Visit  Medication Sig Dispense Refill  . albuterol (VENTOLIN HFA) 108 (90 Base) MCG/ACT inhaler Inhale 2 puffs into the lungs every 6 (six) hours as needed for wheezing or shortness of breath. 18 g 1  . betamethasone dipropionate 0.05 % cream Apply thin layer to affected area every day for 6 weeks then every other day for 6 weeks then 2-3 times weekly PRN.    Marland Kitchen Cholecalciferol (VITAMIN D3) 2000 units TABS Take 1 capsule by mouth daily.    Marland Kitchen levothyroxine (SYNTHROID) 25 MCG tablet Take 1 tablet (25 mcg total) by mouth daily. 90 tablet 3  . meloxicam (MOBIC) 7.5 MG tablet Take 7.5 mg by mouth in the morning and at bedtime.    . potassium chloride (MICRO-K) 10 MEQ CR capsule TAKE 1 CAPSULE BY MOUTH ONCE DAILY. 90 capsule 0  . spironolactone (ALDACTONE) 25 MG tablet Take 1 tablet (25 mg total) by mouth daily. Patient states she takes 1/2 tablet Tuesday, Thursday,  Saturday, Sunday 45 tablet 0  . traMADol (ULTRAM) 50 MG tablet      No current facility-administered medications on file prior to visit.   Past Medical History:  Diagnosis Date  . Asthma   . Atrophy of thyroid (acquired)   . Cellulitis of right external ear   . Essential (primary) hypertension   . H/O left knee surgery    Dr. Nancie Neas  . Migraine without aura, not intractable, without status migrainosus   . Mild intermittent asthma, uncomplicated   . Other specified cardiac arrhythmias   . PVC (premature ventricular contraction)    Past Surgical History:  Procedure Laterality Date  . ABDOMINAL HYSTERECTOMY    . CHOLECYSTECTOMY    . right shoulder surgery  2006    Family History  Problem Relation Age of Onset  . Ovarian cancer Mother   . Hypertension Mother   . Heart attack Father   . Diabetes type II Father   . Asthma Brother    Social History   Socioeconomic History  . Marital status: Married    Spouse name: Not on file  . Number of children: 2  . Years of education: Not on file  . Highest education level: Not on file  Occupational History  . 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 Smoking status: Never Smoker  . Smokeless tobacco: Never  Used  Vaping Use  . Vaping Use: Never used  Substance and Sexual Activity  . Alcohol use: Never  . Drug use: Never  . Sexual activity: Not on file  Other Topics Concern  . Not on file  Social History Narrative  . Not on file   Social Determinants of Health   Financial Resource Strain: Not on file  Food Insecurity: Not on file  Transportation Needs: Not on file  Physical Activity: Not on file  Stress: Not on file  Social Connections: Not on file    Review of Systems  Constitutional: Negative for chills, fatigue and fever.  HENT: Positive for ear pain (right). Negative for congestion, rhinorrhea and sore throat.   Respiratory: Negative for cough and shortness of breath.   Cardiovascular:  Negative for chest pain.  Gastrointestinal: Negative for abdominal pain, constipation, diarrhea, nausea and vomiting.  Genitourinary: Negative for dysuria and urgency.  Musculoskeletal: Negative for back pain and myalgias.  Neurological: Positive for dizziness and light-headedness. Negative for weakness and headaches.  Psychiatric/Behavioral: Negative for dysphoric mood. The patient is not nervous/anxious.      Objective:  BP 120/80   Pulse 76   Temp (!) 97.3 F (36.3 C)   Ht 5\' 4"  (1.626 m)   Wt 165 lb (74.8 kg)   SpO2 96%   BMI 28.32 kg/m   BP/Weight 02/23/2020 01/07/2020 [id number]  Systolic BP [id number]  Diastolic BP 80 70 80  Wt. (Lbs) 165 167 163  BMI 28.32 28.67 27.98    Physical Exam Vitals reviewed.  Constitutional:      Appearance: Normal appearance. She is normal weight.  HENT:     Right Ear: Tympanic membrane, ear canal and external ear normal.     Left Ear: Tympanic membrane, ear canal and external ear normal.     Nose: Nose normal.     Mouth/Throat:     Pharynx: Oropharynx is clear.  Neck:     Vascular: No carotid bruit.  Cardiovascular:     Rate and Rhythm: Normal rate and regular rhythm.     Pulses: Normal pulses.     Heart sounds: Normal heart sounds. No murmur heard.   Pulmonary:     Effort: Pulmonary effort is normal. No respiratory distress.     Breath sounds: Normal breath sounds.  Abdominal:     Palpations: Abdomen is soft.     Tenderness: There is no abdominal tenderness.  Neurological:     Mental Status: She is alert and oriented to person, place, and time.  Psychiatric:        Mood and Affect: Mood normal.        Behavior: Behavior normal.     Diabetic Foot Exam - Simple   No data filed      Lab Results  Component Value Date   WBC 9.2 08/23/2019   HGB 14.4 08/23/2019   HCT 43.5 08/23/2019   PLT 309 08/23/2019   GLUCOSE 92 08/23/2019   CHOL 220 (H) 08/23/2019   TRIG 102 08/23/2019   HDL 69 08/23/2019   LDLCALC 133 (H)  08/23/2019   ALT 26 08/23/2019   AST 21 08/23/2019   NA 142 08/23/2019   K 4.5 08/23/2019   CL 102 08/23/2019   CREATININE 0.75 08/23/2019   BUN 15 08/23/2019   CO2 21 08/23/2019   TSH 2.470 08/23/2019      Assessment & Plan:   1. Acquired hypothyroidism - TSH  2. Essential hypertension Continue current  medications. Low salt diet. - Lipid panel - Comprehensive metabolic panel - CBC with Differential/Platelet  3. PVC (premature ventricular contraction) Continue sotalol.  4. Mild intermittent asthma without complication Continue Ventolin as needed  5. Mixed stress and urge urinary incontinence Continue symptom management with pads.  6.  Hyperlipidemia Continue low-fat diet.  Consider statin medicine. Continue exercise.  7.  Small amount of earwax. Recommend Debrox.  8.  Dizziness: Patient nearly fell coming off with a table in the room.  I think she honestly just missed the step.  She denies any continued dizziness.  If this worsens I would recommend she call back in would consider giving him medication or some exercises for BPV  Meds ordered this encounter  Medications  . acebutolol (SECTRAL) 200 MG capsule    Sig: Take 1 capsule (200 mg total) by mouth daily. Patient states she takes 1 tablet on Monday, Wednesday, Friday    Dispense:  45 capsule    Refill:  3    Orders Placed This Encounter  Procedures  . Lipid panel  . Comprehensive metabolic panel  . CBC with Differential/Platelet  . TSH      Follow-up: Return in about 6 months (around 08/22/2020) for Fasting.Marland Kitchen  An After Visit Summary was printed and given to the patient.  Rochel Brome, MD [facility] [phone]

## 2020-02-24 LAB — COMPREHENSIVE METABOLIC PANEL
ALT: 25 IU/L (ref 0–32)
AST: 19 IU/L (ref 0–40)
Albumin/Globulin Ratio: 1.7 (ref 1.2–2.2)
Albumin: 4.6 g/dL (ref 3.8–4.8)
Alkaline Phosphatase: 122 IU/L — ABNORMAL HIGH (ref 44–121)
BUN/Creatinine Ratio: 22 (ref 12–28)
BUN: 17 mg/dL (ref 8–27)
Bilirubin Total: 0.6 mg/dL (ref 0.0–1.2)
CO2: 24 mmol/L (ref 20–29)
Calcium: 9.8 mg/dL (ref 8.7–10.3)
Chloride: 101 mmol/L (ref 96–106)
Creatinine, Ser: 0.79 mg/dL (ref 0.57–1.00)
GFR calc Af Amer: 88 mL/min/{1.73_m2} (ref >59)
GFR calc non Af Amer: 76 mL/min/{1.73_m2} (ref >59)
Globulin, Total: 2.7 g/dL (ref 1.5–4.5)
Glucose: 92 mg/dL (ref 65–99)
Potassium: 4.4 mmol/L (ref 3.5–5.2)
Sodium: 141 mmol/L (ref 134–144)
Total Protein: 7.3 g/dL (ref 6.0–8.5)

## 2020-02-24 LAB — LIPID PANEL
Chol/HDL Ratio: 3.2 ratio (ref 0.0–4.4)
Cholesterol, Total: 204 mg/dL — ABNORMAL HIGH (ref 100–199)
HDL: 63 mg/dL (ref >39)
LDL Chol Calc (NIH): 124 mg/dL — ABNORMAL HIGH (ref 0–99)
Triglycerides: 94 mg/dL (ref 0–149)
VLDL Cholesterol Cal: 17 mg/dL (ref 5–40)

## 2020-02-24 LAB — CBC WITH DIFFERENTIAL/PLATELET
Basophils Absolute: 0.1 10*3/uL (ref 0.0–0.2)
Basos: 1 %
EOS (ABSOLUTE): 0.1 10*3/uL (ref 0.0–0.4)
Eos: 1 %
Hematocrit: 46 % (ref 34.0–46.6)
Hemoglobin: 14.8 g/dL (ref 11.1–15.9)
Immature Grans (Abs): 0.1 10*3/uL (ref 0.0–0.1)
Immature Granulocytes: 1 %
Lymphocytes Absolute: 2.5 10*3/uL (ref 0.7–3.1)
Lymphs: 25 %
MCH: 26.7 pg (ref 26.6–33.0)
MCHC: 32.2 g/dL (ref 31.5–35.7)
MCV: 83 fL (ref 79–97)
Monocytes Absolute: 0.7 10*3/uL (ref 0.1–0.9)
Monocytes: 7 %
Neutrophils Absolute: 6.6 10*3/uL (ref 1.4–7.0)
Neutrophils: 65 %
Platelets: 295 10*3/uL (ref 150–450)
RBC: 5.55 x10E6/uL — ABNORMAL HIGH (ref 3.77–5.28)
RDW: 13.4 % (ref 11.7–15.4)
WBC: 10.1 10*3/uL (ref 3.4–10.8)

## 2020-02-24 LAB — TSH: TSH: 2.18 u[IU]/mL (ref 0.450–4.500)

## 2020-02-24 LAB — CARDIOVASCULAR RISK ASSESSMENT

## 2020-04-05 ENCOUNTER — Other Ambulatory Visit: Payer: Self-pay

## 2020-04-05 ENCOUNTER — Ambulatory Visit (INDEPENDENT_AMBULATORY_CARE_PROVIDER_SITE_OTHER): Payer: PPO

## 2020-04-05 VITALS — BP 132/82 | HR 62 | Resp 16 | Ht 64.0 in | Wt 166.6 lb

## 2020-04-05 DIAGNOSIS — Z0000 Encounter for general adult medical examination without abnormal findings: Secondary | ICD-10-CM | POA: Diagnosis not present

## 2020-04-05 NOTE — Progress Notes (Signed)
Subjective:   Erika Shepherd is a 70 y.o. female who presents for Medicare Annual (Subsequent) preventive examination.  This wellness visit is conducted by a nurse.  The patient's medications were reviewed and reconciled since the patient's last visit.  History details were provided by the patient.  The history appears to be reliable.    Patient's last AWV was over one year ago.   Medical History: Patient history and Family history was reviewed  Medications, Allergies, and preventative health maintenance was reviewed and updated.  Review of Systems    Review of Systems  Constitutional: Negative.  Negative for appetite change, fatigue and unexpected weight change.  HENT: Negative.   Eyes: Negative.  Negative for visual disturbance.  Respiratory: Negative.  Negative for cough, chest tightness and shortness of breath.   Cardiovascular: Negative.  Negative for chest pain and palpitations.  Gastrointestinal: Negative.   Genitourinary: Negative.  Negative for difficulty urinating, dysuria and hematuria.  Musculoskeletal: Positive for arthralgias. Negative for gait problem and myalgias.       Left knee arthritis  Skin: Negative.  Negative for rash and wound.  Neurological: Negative.  Negative for tremors, numbness and headaches.  Psychiatric/Behavioral: Negative.  Negative for agitation, behavioral problems, dysphoric mood and suicidal ideas. The patient is not nervous/anxious.    Cardiac Risk Factors include: advanced age (>19men, >85 women);dyslipidemia     Objective:    Today's Vitals   04/05/20 0857  BP: 132/82  Pulse: 62  Resp: 16  Weight: 166 lb 9.6 oz (75.6 kg)  Height: 5\' 4"  (1.626 m)  PainSc: 0-No pain   Body mass index is 28.6 kg/m.  Advanced Directives 04/05/2020 02/25/2017  Does Patient Have a Medical Advance Directive? Yes Yes  Type of Advance Directive Living will;Healthcare Power of [place];Living will  Does patient want to make changes to  medical advance directive? No - Patient declined No - Patient declined  Copy of [place] in Chart? No - copy requested No - copy requested    Current Medications (verified) Outpatient Encounter Medications as of 04/05/2020  Medication Sig  . acebutolol (SECTRAL) 200 MG capsule Take 1 capsule (200 mg total) by mouth daily. Patient states she takes 1 tablet on Monday, Wednesday, Friday  . albuterol (VENTOLIN HFA) 108 (90 Base) MCG/ACT inhaler Inhale 2 puffs into the lungs every 6 (six) hours as needed for wheezing or shortness of breath.  . betamethasone dipropionate 0.05 % cream Apply thin layer to affected area every day for 6 weeks then every other day for 6 weeks then 2-3 times weekly PRN.  Marland Kitchen Cholecalciferol (VITAMIN D3) 2000 units TABS Take 1 capsule by mouth daily.  Marland Kitchen levothyroxine (SYNTHROID) 25 MCG tablet Take 1 tablet (25 mcg total) by mouth daily.  . meloxicam (MOBIC) 7.5 MG tablet Take 7.5 mg by mouth in the morning and at bedtime.  . potassium chloride (MICRO-K) 10 MEQ CR capsule TAKE 1 CAPSULE BY MOUTH ONCE DAILY.  Marland Kitchen spironolactone (ALDACTONE) 25 MG tablet Take 1 tablet (25 mg total) by mouth daily. Patient states she takes 1/2 tablet Tuesday, Thursday, Saturday, Sunday  . traMADol (ULTRAM) 50 MG tablet    No facility-administered encounter medications on file as of 04/05/2020.    Allergies (verified) Diovan [valsartan], Labetalol hcl, Lisinopril, Toprol xl [metoprolol], Ibuprofen, and Sulfamethoxazole   History: Past Medical History:  Diagnosis Date  . Asthma   . Atrophy of thyroid (acquired)   . Cellulitis of right external ear   .  Essential (primary) hypertension   . H/O left knee surgery    Dr. Nancie Neas  . Migraine without aura, not intractable, without status migrainosus   . Mild intermittent asthma, uncomplicated   . Other specified cardiac arrhythmias   . PVC (premature ventricular contraction)    Past Surgical History:  Procedure Laterality  Date  . ABDOMINAL HYSTERECTOMY    . CHOLECYSTECTOMY    . right shoulder surgery  2006   Family History  Problem Relation Age of Onset  . Ovarian cancer Mother   . Hypertension Mother   . Heart attack Father   . Diabetes type II Father   . Asthma Brother    Social History   Socioeconomic History  . Marital status: Married    Spouse name: Not on file  . Number of children: 2  . Years of education: Not on file  . Highest education level: Not on file  Occupational History  . 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 Smoking status: Never Smoker  . Smokeless tobacco: Never Used  Vaping Use  . Vaping Use: Never used  Substance and Sexual Activity  . Alcohol use: Never  . Drug use: Never  . Sexual activity: Not on file   Social Determinants of Health   Financial Resource Strain: None  Food Insecurity: None  Transportation Needs: None  Physical Activity: goes to the gym  Stress: None  Social Connections: Not on file    Tobacco Counseling Counseling given: Never Smoker  Clinical Intake:  Pre-visit preparation completed: Yes  Pain : No/denies pain Pain Score: 0-No pain   BMI - recorded: 28.6 Nutritional Status: BMI 25 -29 Overweight Nutritional Risks: None Diabetes: No  How often do you need to have someone help you when you read instructions, pamphlets, or other written materials from your doctor or pharmacy?: 3 - Sometimes  Diabetic? No  Interpreter Needed?: No     Activities of Daily Living In your present state of health, do you have any difficulty performing the following activities: 04/05/2020 08/23/2019  Hearing? N N  Vision? N N  Difficulty concentrating or making decisions? Y N  Comment patient notices being forgetful -  Walking or climbing stairs? N Y  Comment - Currently in PT for left knee, using cane.  Dressing or bathing? N N  Doing errands, shopping? N N  Preparing Food and eating ? N -  Using the Toilet? N -  In the  past six months, have you accidently leaked urine? N -  Do you have problems with loss of bowel control? N -  Managing your Medications? N -  Managing your Finances? N -  Housekeeping or managing your Housekeeping? N -  Some recent data might be hidden    Patient Care Team: Rochel Brome, MD as PCP - General (Family Medicine) Netta Cedars, MD as Consulting Physician (Orthopedic Surgery) Richardo Priest, MD as Consulting Physician (Cardiology)     Assessment:   This is a routine wellness examination for Izumi.  Vision screen Recently had eye exam and dental cleaning  Dietary issues and exercise activities discussed: Current Exercise Habits: Structured exercise class, Type of exercise: treadmill;strength training/weights, Time (Minutes): 30, Frequency (Times/Week): 4, Weekly Exercise (Minutes/Week): 120, Intensity: Mild, Exercise limited by: orthopedic condition(s)  Depression Screen PHQ 2/9 Scores 04/05/2020 08/23/2019  PHQ - 2 Score 0 0    Fall Risk Fall Risk  04/05/2020 08/23/2019  Falls in the past year? 1 0  Number  falls in past yr: 1 0  Injury with Fall? 0 0  Risk for fall due to : History of fall(s);Orthopedic patient Impaired mobility  Risk for fall due to: Comment surgery to left knee in June 2021 -  Follow up Falls evaluation completed;Education provided;Falls prevention discussed Education provided;Falls prevention discussed;Falls evaluation completed   ASSISTIVE DEVICES UTILIZED TO PREVENT FALLS:  Life alert? No  Use of a cane, walker or w/c? No  Gait steady and fast without use of assistive device  Cognitive Function:     6CIT Screen 04/05/2020  What Year? 0 points  What month? 0 points  What time? 0 points  Count back from 20 0 points  Months in reverse 0 points  Repeat phrase 0 points  Total Score 0    Immunizations  There is no immunization history on file for this patient.  TDAP status: Due, Education has been provided regarding the importance of this  vaccine. Advised may receive this vaccine at local pharmacy or Health Dept. Aware to provide a copy of the vaccination record if obtained from local pharmacy or Health Dept. Verbalized acceptance and understanding.  Flu Vaccine status: Declined, Education has been provided regarding the importance of this vaccine but patient still declined. Advised may receive this vaccine at local pharmacy or Health Dept. Aware to provide a copy of the vaccination record if obtained from local pharmacy or Health Dept. Verbalized acceptance and understanding.  Pneumococcal vaccine status: Declined,  Education has been provided regarding the importance of this vaccine but patient still declined. Advised may receive this vaccine at local pharmacy or Health Dept. Aware to provide a copy of the vaccination record if obtained from local pharmacy or Health Dept. Verbalized acceptance and understanding.   Covid-19 vaccine status: Declined, Education has been provided regarding the importance of this vaccine but patient still declined. Advised may receive this vaccine at local pharmacy or Health Dept.or vaccine clinic. Aware to provide a copy of the vaccination record if obtained from local pharmacy or Health Dept. Verbalized acceptance and understanding.  Qualifies for Shingles Vaccine? Yes   Zostavax completed No   Shingrix Completed?: No.    Education has been provided regarding the importance of this vaccine. Patient has been advised to call insurance company to determine out of pocket expense if they have not yet received this vaccine. Advised may also receive vaccine at local pharmacy or Health Dept. Verbalized acceptance and understanding.  Screening Tests Health Maintenance  Topic Date Due  . COVID-19 Vaccine (1) 04/21/2020 (Originally 10/02/1955)  . INFLUENZA VACCINE  04/27/2020 (Originally 08/29/2019)  . MAMMOGRAM  06/22/2020 (Originally 10/02/1999)  . PNA vac Low Risk Adult (1 of 2 - PCV13) 06/22/2020 (Originally  10/02/2014)  . TETANUS/TDAP  08/22/2020 (Originally 10/01/1969)  . COLONOSCOPY (Pts 45-41yrs Insurance coverage will need to be confirmed)  02/22/2021 (Originally 01/28/2013)  . DEXA SCAN  Completed    Health Maintenance  Colorectal cancer screening: Patient Declined (Cologuard and Colonoscopy)  Mammogram status: Patient Declined  Bone Density status: Completed in 2018, patient declined repeat DEXA  Lung Cancer Screening: (Low Dose CT Chest recommended if Age 28-80 years, 30 pack-year currently smoking OR have quit w/in 15years.) does not qualify.   Additional Screening:  Vision Screening: Recommended annual ophthalmology exams for early detection of glaucoma and other disorders of the eye. Is the patient up to date with their annual eye exam?  Yes   Dental Screening: Recommended annual dental exams for proper oral hygiene - completed for  this year    Plan:    - Continue with exercise and diet modifications to lower cholesterol.  Patient has cut out chips which she had daily.  Printed information given for the DASH Diet. - Patient declined the following: Flu Vaccine, PNA Vaccine, Tetanus Vaccine, COVID Vaccine, Colorectal Cancer Screening, Mammogram, and DEXA scan.  Education given. - Requested copy of advance directive for our records  I have personally reviewed and noted the following in the patient's chart:   . Medical and social history . Use of alcohol, tobacco or illicit drugs  . Current medications and supplements . Functional ability and status . Nutritional status . Physical activity . Advanced directives . List of other physicians . Hospitalizations, surgeries, and ER visits in previous 12 months . Vitals . Screenings to include cognitive, depression, and falls . Referrals and appointments  In addition, I have reviewed and discussed with patient certain preventive protocols, quality metrics, and best practice recommendations. A written personalized care plan for  preventive services as well as general preventive health recommendations were provided to patient.     Erie Noe, LPN   07/29/8977

## 2020-04-05 NOTE — Patient Instructions (Signed)
Bone Density Test A bone density test uses a type of X-ray to measure the amount of calcium and other minerals in a person's bones. It can measure bone density in the hip and the spine. The test is similar to having a regular X-ray. This test may also be called:  Bone densitometry.  Bone mineral density test.  Dual-energy X-ray absorptiometry (DEXA). You may have this test to:  Diagnose a condition that causes weak or thin bones (osteoporosis).  Screen you for osteoporosis.  Predict your risk for a broken bone (fracture).  Determine how well your osteoporosis treatment is working. Tell a health care provider about:  Any allergies you have.  All medicines you are taking, including vitamins, herbs, eye drops, creams, and over-the-counter medicines.  Any problems you or family members have had with anesthetic medicines.  Any blood disorders you have.  Any surgeries you have had.  Any medical conditions you have.  Whether you are pregnant or may be pregnant.  Any medical tests you have had within the past 14 days that used contrast material. What are the risks? Generally, this is a safe test. However, it does expose you to a small amount of radiation, which can slightly increase your cancer risk. What happens before the test?  Do not take any calcium supplements within the 24 hours before your test.  You will need to remove all metal jewelry, eyeglasses, removable dental appliances, and any other metal objects on your body. What happens during the test?  You will lie down on an exam table. There will be an X-ray generator below you and an imaging device above you.  Other devices, such as boxes or braces, may be used to position your body properly for the scan.  The machine will slowly scan your body. You will need to keep very still while the machine does the scan.  The images will show up on a screen in the room. Images will be examined by a specialist after your  test is finished. The procedure may vary among health care providers and hospitals.   What can I expect after the test? It is up to you to get the results of your test. Ask your health care provider, or the department that is doing the test, when your results will be ready. Summary  A bone density test is an imaging test that uses a type of X-ray to measure the amount of calcium and other minerals in your bones.  The test may be used to diagnose or screen you for a condition that causes weak or thin bones (osteoporosis), predict your risk for a broken bone (fracture), or determine how well your osteoporosis treatment is working.  Do not take any calcium supplements within 24 hours before your test.  Ask your health care provider, or the department that is doing the test, when your results will be ready. This information is not intended to replace advice given to you by your health care provider. Make sure you discuss any questions you have with your health care provider. Document Revised: 07/01/2019 Document Reviewed: 07/01/2019 Elsevier Patient Education  2021 [employer].   Colonoscopy, Adult A colonoscopy is a procedure to look at the entire large intestine. This procedure is done using a long, thin, flexible tube that has a camera on the end. You may have a colonoscopy:  As a part of normal colorectal screening.  If you have certain symptoms, such as: ? A low number of red blood cells  in your blood (anemia). ? Diarrhea that does not go away. ? Pain in your abdomen. ? Blood in your stool. A colonoscopy can help screen for and diagnose medical problems, including:  Tumors.  Extra tissue that grows where mucus forms (polyps).  Inflammation.  Areas of bleeding. Tell your health care provider about:  Any allergies you have.  All medicines you are taking, including vitamins, herbs, eye drops, creams, and over-the-counter medicines.  Any problems you or family members have  had with anesthetic medicines.  Any blood disorders you have.  Any surgeries you have had.  Any medical conditions you have.  Any problems you have had with having bowel movements.  Whether you are pregnant or may be pregnant. What are the risks? Generally, this is a safe procedure. However, problems may occur, including:  Bleeding.  Damage to your intestine.  Allergic reactions to medicines given during the procedure.  Infection. This is rare. What happens before the procedure? Eating and drinking restrictions Follow instructions from your health care provider about eating or drinking restrictions, which may include:  A few days before the procedure: ? Follow a low-fiber diet. ? Avoid nuts, seeds, dried fruit, raw fruits, and vegetables.  1-3 days before the procedure: ? Eat only gelatin dessert or ice pops. ? Drink only clear liquids, such as water, clear juice, clear broth or bouillon, black coffee or tea, or clear soft drinks or sports drinks. ? Avoid liquids that contain red or purple dye.  The day of the procedure: ? Do not eat solid foods. You may continue to drink clear liquids until up to 2 hours before the procedure. ? Do not eat or drink anything starting 2 hours before the procedure, or within the time period that your health care provider recommends. Bowel prep If you were prescribed a bowel prep to take by mouth (orally) to clean out your colon:  Take it as told by your health care provider. Starting the day before your procedure, you will need to drink a large amount of liquid medicine. The liquid will cause you to have many bowel movements of loose stool until your stool becomes almost clear or light green.  If your skin or the opening between the buttocks (anus) gets irritated from diarrhea, you may relieve the irritation using: ? Wipes with medicine in them, such as adult wet wipes with aloe and vitamin E. ? A product to soothe skin, such as petroleum  jelly.  If you vomit while drinking the bowel prep: ? Take a break for up to 60 minutes. ? Begin the bowel prep again. ? Call your health care provider if you keep vomiting or you cannot take the bowel prep without vomiting.  To clean out your colon, you may also be given: ? Laxative medicines. These help you have a bowel movement. ? Instructions for enema use. An enema is liquid medicine injected into your rectum. Medicines Ask your health care provider about:  Changing or stopping your regular medicines or supplements. This is especially important if you are taking iron supplements, diabetes medicines, or blood thinners.  Taking medicines such as aspirin and ibuprofen. These medicines can thin your blood. Do not take these medicines unless your health care provider tells you to take them.  Taking over-the-counter medicines, vitamins, herbs, and supplements. General instructions  Ask your health care provider what steps will be taken to help prevent infection. These may include washing skin with a germ-killing soap.  Plan to have someone take  you home from the hospital or clinic. What happens during the procedure?  An IV will be inserted into one of your veins.  You may be given one or more of the following: ? A medicine to help you relax (sedative). ? A medicine to numb the area (local anesthetic). ? A medicine to make you fall asleep (general anesthetic). This is rarely needed.  You will lie on your side with your knees bent.  The tube will: ? Have oil or gel put on it (be lubricated). ? Be inserted into your anus. ? Be gently eased through all parts of your large intestine.  Air will be sent into your colon to keep it open. This may cause some pressure or cramping.  Images will be taken with the camera and will appear on a screen.  A small tissue sample may be removed to be looked at under a microscope (biopsy). The tissue may be sent to a lab for testing if any signs  of problems are found.  If small polyps are found, they may be removed and checked for cancer cells.  When the procedure is finished, the tube will be removed. The procedure may vary among health care providers and hospitals.   What happens after the procedure?  Your blood pressure, heart rate, breathing rate, and blood oxygen level will be monitored until you leave the hospital or clinic.  You may have a small amount of blood in your stool.  You may pass gas and have mild cramping or bloating in your abdomen. This is caused by the air that was used to open your colon during the exam.  Do not drive for 24 hours after the procedure.  It is up to you to get the results of your procedure. Ask your health care provider, or the department that is doing the procedure, when your results will be ready. Summary  A colonoscopy is a procedure to look at the entire large intestine.  Follow instructions from your health care provider about eating and drinking before the procedure.  If you were prescribed an oral bowel prep to clean out your colon, take it as told by your health care provider.  During the colonoscopy, a flexible tube with a camera on its end is inserted into the anus and then passed into the other parts of the large intestine. This information is not intended to replace advice given to you by your health care provider. Make sure you discuss any questions you have with your health care provider. Document Revised: 08/07/2018 Document Reviewed: 08/07/2018 Elsevier Patient Education  2021 [place] and Cholesterol Restricted Eating Plan Getting too much fat and cholesterol in your diet may cause health problems. Choosing the right foods helps keep your fat and cholesterol at normal levels. This can keep you from getting certain diseases. Your doctor may recommend an eating plan that includes:  Total fat: ______% or less of total calories a day.  Saturated fat: ______%  or less of total calories a day.  Cholesterol: less than _________mg a day.  Fiber: ______g a day. What are tips for following this plan? Meal planning  At meals, divide your plate into four equal parts: ? Fill one-half of your plate with vegetables and green salads. ? Fill one-fourth of your plate with whole grains. ? Fill one-fourth of your plate with low-fat (lean) protein foods.  Eat fish that is high in omega-3 fats at least two times a week. This includes mackerel,  tuna, sardines, and salmon.  Eat foods that are high in fiber, such as whole grains, beans, apples, broccoli, carrots, peas, and barley. General tips  Work with your doctor to lose weight if you need to.  Avoid: ? Foods with added sugar. ? Fried foods. ? Foods with partially hydrogenated oils.  Limit alcohol intake to no more than 1 drink a day for nonpregnant women and 2 drinks a day for men. One drink equals 12 oz of beer, 5 oz of wine, or 1 oz of hard liquor.   Reading food labels  Check food labels for: ? Trans fats. ? Partially hydrogenated oils. ? Saturated fat (g) in each serving. ? Cholesterol (mg) in each serving. ? Fiber (g) in each serving.  Choose foods with healthy fats, such as: ? Monounsaturated fats. ? Polyunsaturated fats. ? Omega-3 fats.  Choose grain products that have whole grains. Look for the word "whole" as the first word in the ingredient list. Cooking  Cook foods using low-fat methods. These include baking, boiling, grilling, and broiling.  Eat more home-cooked foods. Eat at restaurants and buffets less often.  Avoid cooking using saturated fats, such as butter, cream, palm oil, palm kernel oil, and coconut oil. Recommended foods Fruits  All fresh, canned (in natural juice), or frozen fruits. Vegetables  Fresh or frozen vegetables (raw, steamed, roasted, or grilled). Green salads. Grains  Whole grains, such as whole wheat or whole grain breads, crackers, cereals, and  pasta. Unsweetened oatmeal, bulgur, barley, quinoa, or brown rice. Corn or whole wheat flour tortillas. Meats and other protein foods  Ground beef (85% or leaner), grass-fed beef, or beef trimmed of fat. Skinless chicken or [place]. Ground chicken or [place]. Pork trimmed of fat. All fish and seafood. Egg whites. Dried beans, peas, or lentils. Unsalted nuts or seeds. Unsalted canned beans. Nut butters without added sugar or oil. Dairy  Low-fat or nonfat dairy products, such as skim or 1% milk, 2% or reduced-fat cheeses, low-fat and fat-free ricotta or cottage cheese, or plain low-fat and nonfat yogurt. Fats and oils  Tub margarine without trans fats. Light or reduced-fat mayonnaise and salad dressings. Avocado. Olive, canola, sesame, or safflower oils. The items listed above may not be a complete list of foods and beverages you can eat. Contact a dietitian for more information.   Foods to avoid Fruits  Canned fruit in heavy syrup. Fruit in cream or butter sauce. Fried fruit. Vegetables  Vegetables cooked in cheese, cream, or butter sauce. Fried vegetables. Grains  White bread. White pasta. White rice. Cornbread. Bagels, pastries, and croissants. Crackers and snack foods that contain trans fat and hydrogenated oils. Meats and other protein foods  Fatty cuts of meat. Ribs, chicken wings, bacon, sausage, bologna, salami, chitterlings, fatback, hot dogs, bratwurst, and packaged lunch meats. Liver and organ meats. Whole eggs and egg yolks. Chicken and [place] with skin. Fried meat. Dairy  Whole or 2% milk, cream, half-and-half, and cream cheese. Whole milk cheeses. Whole-fat or sweetened yogurt. Full-fat cheeses. Nondairy creamers and whipped toppings. Processed cheese, cheese spreads, and cheese curds. Beverages  Alcohol. Sugar-sweetened drinks such as sodas, lemonade, and fruit drinks. Fats and oils  Butter, stick margarine, lard, shortening, ghee, or bacon fat. Coconut, palm kernel, and  palm oils. Sweets and desserts  Corn syrup, sugars, honey, and molasses. Candy. Jam and jelly. Syrup. Sweetened cereals. Cookies, pies, cakes, donuts, muffins, and ice cream. The items listed above may not be a complete list of foods and beverages you  should avoid. Contact a dietitian for more information. Summary  Choosing the right foods helps keep your fat and cholesterol at normal levels. This can keep you from getting certain diseases.  At meals, fill one-half of your plate with vegetables and green salads.  Eat high-fiber foods, like whole grains, beans, apples, carrots, peas, and barley.  Limit added sugar, saturated fats, alcohol, and fried foods. This information is not intended to replace advice given to you by your health care provider. Make sure you discuss any questions you have with your health care provider. Document Revised: 05/19/2019 Document Reviewed: 05/19/2019 Elsevier Patient Education  2021 [place] Prevention in the Home, Adult Falls can cause injuries and can happen to people of all ages. There are many things you can do to make your home safe and to help prevent falls. Ask for help when making these changes. What actions can I take to prevent falls? General Instructions  Use good lighting in all rooms. Replace any light bulbs that burn out.  Turn on the lights in dark areas. Use night-lights.  Keep items that you use often in easy-to-reach places. Lower the shelves around your home if needed.  Set up your furniture so you have a clear path. Avoid moving your furniture around.  Do not have throw rugs or other things on the floor that can make you trip.  Avoid walking on wet floors.  If any of your floors are uneven, fix them.  Add color or contrast paint or tape to clearly mark and help you see: ? Grab bars or handrails. ? First and last steps of staircases. ? Where the edge of each step is.  If you use a stepladder: ? Make sure that  it is fully opened. Do not climb a closed stepladder. ? Make sure the sides of the stepladder are locked in place. ? Ask someone to hold the stepladder while you use it.  Know where your pets are when moving through your home. What can I do in the bathroom?  Keep the floor dry. Clean up any water on the floor right away.  Remove soap buildup in the tub or shower.  Use nonskid mats or decals on the floor of the tub or shower.  Attach bath mats securely with double-sided, nonslip rug tape.  If you need to sit down in the shower, use a plastic, nonslip stool.  Install grab bars by the toilet and in the tub and shower. Do not use towel bars as grab bars.      What can I do in the bedroom?  Make sure that you have a light by your bed that is easy to reach.  Do not use any sheets or blankets for your bed that hang to the floor.  Have a firm chair with side arms that you can use for support when you get dressed. What can I do in the kitchen?  Clean up any spills right away.  If you need to reach something above you, use a step stool with a grab bar.  Keep electrical cords out of the way.  Do not use floor polish or wax that makes floors slippery. What can I do with my stairs?  Do not leave any items on the stairs.  Make sure that you have a light switch at the top and the bottom of the stairs.  Make sure that there are handrails on both sides of the stairs. Fix handrails that are broken or  loose.  Install nonslip stair treads on all your stairs.  Avoid having throw rugs at the top or bottom of the stairs.  Choose a carpet that does not hide the edge of the steps on the stairs.  Check carpeting to make sure that it is firmly attached to the stairs. Fix carpet that is loose or worn. What can I do on the outside of my home?  Use bright outdoor lighting.  Fix the edges of walkways and driveways and fix any cracks.  Remove anything that might make you trip as you walk  through a door, such as a raised step or threshold.  Trim any bushes or trees on paths to your home.  Check to see if handrails are loose or broken and that both sides of all steps have handrails.  Install guardrails along the edges of any raised decks and porches.  Clear paths of anything that can make you trip, such as tools or rocks.  Have leaves, snow, or ice cleared regularly.  Use sand or salt on paths during winter.  Clean up any spills in your garage right away. This includes grease or oil spills. What other actions can I take?  Wear shoes that: ? Have a low heel. Do not wear high heels. ? Have rubber bottoms. ? Feel good on your feet and fit well. ? Are closed at the toe. Do not wear open-toe sandals.  Use tools that help you move around if needed. These include: ? Canes. ? Walkers. ? Scooters. ? Crutches.  Review your medicines with your doctor. Some medicines can make you feel dizzy. This can increase your chance of falling. Ask your doctor what else you can do to help prevent falls. Where to find more information  Centers for Disease Control and Prevention, [employer]: http://www.wolf.info/  National Institute on Aging: http://kim-miller.com/ Contact a doctor if:  You are afraid of falling at home.  You feel weak, drowsy, or dizzy at home.  You fall at home. Summary  There are many simple things that you can do to make your home safe and to help prevent falls.  Ways to make your home safe include removing things that can make you trip and installing grab bars in the bathroom.  Ask for help when making these changes in your home. This information is not intended to replace advice given to you by your health care provider. Make sure you discuss any questions you have with your health care provider. Document Revised: 08/18/2019 Document Reviewed: 08/18/2019 Elsevier Patient Education  [place] Maintenance, Female Adopting a healthy lifestyle and getting  preventive care are important in promoting health and wellness. Ask your health care provider about:  The right schedule for you to have regular tests and exams.  Things you can do on your own to prevent diseases and keep yourself healthy. What should I know about diet, weight, and exercise? Eat a healthy diet  Eat a diet that includes plenty of vegetables, fruits, low-fat dairy products, and lean protein.  Do not eat a lot of foods that are high in solid fats, added sugars, or sodium.   Maintain a healthy weight Body mass index (BMI) is used to identify weight problems. It estimates body fat based on height and weight. Your health care provider can help determine your BMI and help you achieve or maintain a healthy weight. Get regular exercise Get regular exercise. This is one of the most important things you can do for  your health. Most adults should:  Exercise for at least 150 minutes each week. The exercise should increase your heart rate and make you sweat (moderate-intensity exercise).  Do strengthening exercises at least twice a week. This is in addition to the moderate-intensity exercise.  Spend less time sitting. Even light physical activity can be beneficial. Watch cholesterol and blood lipids Have your blood tested for lipids and cholesterol at 71 years of age, then have this test every 5 years. Have your cholesterol levels checked more often if:  Your lipid or cholesterol levels are high.  You are older than 71 years of age.  You are at high risk for heart disease. What should I know about cancer screening? Depending on your health history and family history, you may need to have cancer screening at various ages. This may include screening for:  Breast cancer.  Cervical cancer.  Colorectal cancer.  Skin cancer.  Lung cancer. What should I know about heart disease, diabetes, and high blood pressure? Blood pressure and heart disease  High blood pressure causes  heart disease and increases the risk of stroke. This is more likely to develop in people who have high blood pressure readings, are of African descent, or are overweight.  Have your blood pressure checked: ? Every 3-5 years if you are 45-91 years of age. ? Every year if you are 24 years old or older. Diabetes Have regular diabetes screenings. This checks your fasting blood sugar level. Have the screening done:  Once every three years after age 60 if you are at a normal weight and have a low risk for diabetes.  More often and at a younger age if you are overweight or have a high risk for diabetes. What should I know about preventing infection? Hepatitis B If you have a higher risk for hepatitis B, you should be screened for this virus. Talk with your health care provider to find out if you are at risk for hepatitis B infection. Hepatitis C Testing is recommended for:  Everyone born from [birth year] through 1966.  Anyone with known risk factors for hepatitis C. Sexually transmitted infections (STIs)  Get screened for STIs, including gonorrhea and chlamydia, if: ? You are sexually active and are younger than 71 years of age. ? You are older than 71 years of age and your health care provider tells you that you are at risk for this type of infection. ? Your sexual activity has changed since you were last screened, and you are at increased risk for chlamydia or gonorrhea. Ask your health care provider if you are at risk.  Ask your health care provider about whether you are at high risk for HIV. Your health care provider may recommend a prescription medicine to help prevent HIV infection. If you choose to take medicine to prevent HIV, you should first get tested for HIV. You should then be tested every 3 months for as long as you are taking the medicine. Pregnancy  If you are about to stop having your period (premenopausal) and you may become pregnant, seek counseling before you get pregnant.  Take  400 to 800 micrograms (mcg) of folic acid every day if you become pregnant.  Ask for birth control (contraception) if you want to prevent pregnancy. Osteoporosis and menopause Osteoporosis is a disease in which the bones lose minerals and strength with aging. This can result in bone fractures. If you are 67 years old or older, or if you are at risk for osteoporosis and  fractures, ask your health care provider if you should:  Be screened for bone loss.  Take a calcium or vitamin D supplement to lower your risk of fractures.  Be given hormone replacement therapy (HRT) to treat symptoms of menopause. Follow these instructions at home: Lifestyle  Do not use any products that contain nicotine or tobacco, such as cigarettes, e-cigarettes, and chewing tobacco. If you need help quitting, ask your health care provider.  Do not use street drugs.  Do not share needles.  Ask your health care provider for help if you need support or information about quitting drugs. Alcohol use  Do not drink alcohol if: ? Your health care provider tells you not to drink. ? You are pregnant, may be pregnant, or are planning to become pregnant.  If you drink alcohol: ? Limit how much you use to 0-1 drink a day. ? Limit intake if you are breastfeeding.  Be aware of how much alcohol is in your drink. In the [place] one drink equals one 12 oz bottle of beer (355 mL), one 5 oz glass of wine (148 mL), or one 1 oz glass of hard liquor (44 mL). General instructions  Schedule regular health, dental, and eye exams.  Stay current with your vaccines.  Tell your health care provider if: ? You often feel depressed. ? You have ever been abused or do not feel safe at home. Summary  Adopting a healthy lifestyle and getting preventive care are important in promoting health and wellness.  Follow your health care provider's instructions about healthy diet, exercising, and getting tested or screened for diseases.  Follow  your health care provider's instructions on monitoring your cholesterol and blood pressure. This information is not intended to replace advice given to you by your health care provider. Make sure you discuss any questions you have with your health care provider. Document Revised: 01/07/2018 Document Reviewed: 01/07/2018 Elsevier Patient Education  2021 [employer] A mammogram is a low energy X-ray of the breasts that is done to check for abnormal changes. This procedure can screen for and detect any changes that may indicate breast cancer. Mammograms are regularly done on women. A man may have a mammogram if he has a lump or swelling in his breast. A mammogram can also identify other changes and variations in the breast, such as:  Inflammation of the breast tissue (mastitis).  An infected area that contains a collection of pus (abscess).  A fluid-filled sac (cyst).  Fibrocystic changes. This is when breast tissue becomes denser, which can make the tissue feel rope-like or uneven under the skin.  Tumors that are not cancerous (benign). Tell a health care provider:  About any allergies you have.  If you have breast implants.  If you have had previous breast disease, biopsy, or surgery.  If you are breastfeeding.  If you are younger than age 63.  If you have a family history of breast cancer.  Whether you are pregnant or may be pregnant. What are the risks? Generally, this is a safe procedure. However, problems may occur, including:  Exposure to radiation. Radiation levels are very low with this test.  The results being misinterpreted.  The need for further tests.  The inability of the mammogram to detect certain cancers. What happens before the procedure?  Schedule your test about 1-2 weeks after your menstrual period if you are still menstruating. This is usually when your breasts are the least tender.  If you have had  a mammogram done at a different  facility in the past, get the mammogram X-rays or have them sent to your current exam facility. The new and old images will be compared.  Wash your breasts and underarms on the day of the test.  Do not wear deodorants, perfumes, lotions, or powders anywhere on your body on the day of the test.  Remove any jewelry from your neck.  Wear clothes that you can change into and out of easily. What happens during the procedure?  You will undress from the waist up and put on a gown that opens in the front.  You will stand in front of the X-ray machine.  Each breast will be placed between two plastic or glass plates. The plates will compress your breast for a few seconds. Try to stay as relaxed as possible during the procedure. This does not cause any harm to your breasts and any discomfort you feel will be very brief.  X-rays will be taken from different angles of each breast. The procedure may vary among health care providers and hospitals.   What happens after the procedure?  The mammogram will be examined by a specialist (radiologist).  You may need to repeat certain parts of the test, depending on the quality of the images. This is commonly done if the radiologist needs a better view of the breast tissue.  You may resume your normal activities.  It is up to you to get the results of your procedure. Ask your health care provider, or the department that is doing the procedure, when your results will be ready. Summary  A mammogram is a low energy X-ray of the breasts that is done to check for abnormal changes. A man may have a mammogram if he has a lump or swelling in his breast.  If you have had a mammogram done at a different facility in the past, get the mammogram X-rays or have them sent to your current exam facility in order to compare them.  Schedule your test about 1-2 weeks after your menstrual period if you are still menstruating.  For this test, each breast will be placed  between two plastic or glass plates. The plates will compress your breast for a few seconds.  Ask when your test results will be ready. Make sure you get your test results. This information is not intended to replace advice given to you by your health care provider. Make sure you discuss any questions you have with your health care provider. Document Revised: 09/04/2017 Document Reviewed: 09/04/2017 Elsevier Patient Education  [place].

## 2020-05-02 ENCOUNTER — Other Ambulatory Visit: Payer: Self-pay | Admitting: Family Medicine

## 2020-07-26 ENCOUNTER — Other Ambulatory Visit: Payer: Self-pay | Admitting: Physician Assistant

## 2020-07-26 ENCOUNTER — Other Ambulatory Visit: Payer: Self-pay | Admitting: Family Medicine

## 2020-07-27 ENCOUNTER — Other Ambulatory Visit: Payer: Self-pay

## 2020-07-27 MED ORDER — ACEBUTOLOL HCL 200 MG PO CAPS: 200.0000 mg | ORAL_CAPSULE | Freq: Every day | ORAL | 0 refills | Status: DC

## 2020-08-07 ENCOUNTER — Telehealth: Payer: Self-pay | Admitting: Family Medicine

## 2020-08-07 NOTE — Chronic Care Management (AMB) (Signed)
  Chronic Care Management   Note  08/07/2020 Name: Erika Shepherd MRN: [id number] DOB: 12/18/1950  Erika Shepherd is a 70 y.o. year old female who is a primary care patient of Cox, Kirsten, MD. I reached out to [employer] by phone today in response to a referral sent by Ms. Russella Dar PCP, Cox, Kirsten, MD.   [name] was given information about Chronic Care Management services today including:  CCM service includes personalized support from designated clinical staff supervised by her physician, including individualized plan of care and coordination with other care providers 24/7 contact phone numbers for assistance for urgent and routine care needs. Service will only be billed when office clinical staff spend 20 minutes or more in a month to coordinate care. Only one practitioner may furnish and bill the service in a calendar month. The patient may stop CCM services at any time (effective at the end of the month) by phone call to the office staff.   Patient wishes to consider information provided and/or speak with a member of the care team before deciding about enrollment in care management services.   Follow up plan:   Tatjana Secretary/administrator

## 2020-08-21 NOTE — Progress Notes (Signed)
Subjective:  Patient ID: Erika Shepherd, female    DOB: [birth year]  Age: 70 y.o. MRN: [id number]  Chief Complaint  Patient presents with   Hypertension   Hypothyroidism    Erika Shepherd is a 70 year old Caucasian female that presents for follow-up of hypertension and hypothyroidism. She has recently had a routine eye and dental exam. She has declined preventative screenings and immunizations. She tells me that she experienced increased stress recently. She added that she experiencing tingling to bilateral feet, toes, cold hands and feet, burning at the base of her hair line on posterior neck, and rash behind left ear.  She was last seen for hypertension 6 months ago. BP elevated at 142/88. Repeat BP 140/82. Pt states she did not take antihypertensive medications today. BP at that visit was 120/80. Management since that visit includes Acebutolol 200 mg and Spironolactone 25 mg.  She reports excellent compliance with treatment. She is not having side effects.  She is following a Regular diet. She is exercising. She does not smoke.  Use of agents associated with hypertension: thyroid hormones.   Outside blood pressures are not being checked. Symptoms: No chest pain No chest pressure  No palpitations No syncope  No dyspnea No orthopnea  No paroxysmal nocturnal dyspnea No lower extremity edema   Pertinent labs: Lab Results  Component Value Date   CHOL 204 (H) 02/23/2020   HDL 63 02/23/2020   LDLCALC 124 (H) 02/23/2020   TRIG 94 02/23/2020   CHOLHDL 3.2 02/23/2020   Lab Results  Component Value Date   NA 141 02/23/2020   K 4.4 02/23/2020   CREATININE 0.79 02/23/2020   GFRNONAA 76 02/23/2020   GFRAA 88 02/23/2020   GLUCOSE 92 02/23/2020     The 10-year ASCVD risk score Mikey Bussing DC Jr., et al., 2013) is: 15%    Hypothyroidism - Medications: Levothyroxine 25 mcg daily - Current symptoms:  none - Denies change in energy level - Symptoms have been well-controlled    Current Outpatient  Medications on File Prior to Visit  Medication Sig Dispense Refill   acebutolol (SECTRAL) 200 MG capsule Take 1 capsule (200 mg total) by mouth daily. Patient states she takes 1 tablet on Monday, Wednesday, Friday 45 capsule 0   albuterol (VENTOLIN HFA) 108 (90 Base) MCG/ACT inhaler Inhale 2 puffs into the lungs every 6 (six) hours as needed for wheezing or shortness of breath. 18 g 1   betamethasone dipropionate 0.05 % cream Apply thin layer to affected area every day for 6 weeks then every other day for 6 weeks then 2-3 times weekly PRN.     Cholecalciferol (VITAMIN D3) 2000 units TABS Take 1 capsule by mouth daily.     levothyroxine (SYNTHROID) 25 MCG tablet Take 1 tablet (25 mcg total) by mouth daily. 90 tablet 3   meloxicam (MOBIC) 7.5 MG tablet Take 7.5 mg by mouth in the morning and at bedtime.     potassium chloride (MICRO-K) 10 MEQ CR capsule TAKE 1 CAPSULE BY MOUTH ONCE DAILY. 90 capsule 0   spironolactone (ALDACTONE) 25 MG tablet TAKE 1 TABLET BY MOUTH DAILY. PATIENT SAYS TAKES 1/2 TABLET BY MOUTH TUES,THUR,SAT, AND SUNDAY 45 tablet 0   traMADol (ULTRAM) 50 MG tablet      No current facility-administered medications on file prior to visit.   Past Medical History:  Diagnosis Date   Asthma    Atrophy of thyroid (acquired)    Cellulitis of right external ear    Essential (primary) hypertension  H/O left knee surgery    Dr. Nancie Neas   Migraine without aura, not intractable, without status migrainosus    Mild intermittent asthma, uncomplicated    Other specified cardiac arrhythmias    PVC (premature ventricular contraction)    Past Surgical History:  Procedure Laterality Date   ABDOMINAL HYSTERECTOMY     CHOLECYSTECTOMY     right shoulder surgery  2006    Family History  Problem Relation Age of Onset   Ovarian cancer Mother    Hypertension Mother    Heart attack Father    Diabetes type II Father    Asthma Brother    Social History   Socioeconomic History    Marital status: Married    Spouse name: Not on file   Number of children: 2   Years of education: Not on file   Highest education level: Not on file  Occupational History   Occupation: [place]  Tobacco Use   Smoking status: Never   Smokeless tobacco: Never  Vaping Use   Vaping Use: Never used  Substance and Sexual Activity   Alcohol use: Never   Drug use: Never   Sexual activity: Not on file  Other Topics Concern   Not on file  Social History Narrative   Not on file   Social Determinants of Health   Financial Resource Strain: Not on file  Food Insecurity: Not on file  Transportation Needs: Not on file  Physical Activity: Not on file  Stress: Not on file  Social Connections: Not on file    Review of Systems  Constitutional:  Negative for appetite change, fatigue and fever.  HENT:  Negative for congestion, ear pain, sinus pressure and sore throat.   Eyes:  Negative for pain.  Respiratory:  Negative for cough, chest tightness, shortness of breath and wheezing.   Cardiovascular:  Negative for chest pain and palpitations.  Gastrointestinal:  Negative for abdominal pain, constipation, diarrhea, nausea and vomiting.  Endocrine: Positive for cold intolerance.  Genitourinary:  Negative for dysuria and hematuria.  Musculoskeletal:  Negative for arthralgias, back pain, joint swelling and myalgias.  Skin:  Positive for rash (behind left ear).  Allergic/Immunologic: Negative.   Neurological:  Negative for dizziness, weakness and headaches.       Tingling in bilateral feet  Psychiatric/Behavioral:  Negative for dysphoric mood. The patient is not nervous/anxious.     Objective:  BP (!) 142/88 (BP Location: Right Arm, Patient Position: Sitting)   Pulse 64   Temp (!) 97.3 F (36.3 C) (Temporal)   Ht '5\' 4"'$  (1.626 m)   Wt 159 lb 12.8 oz (72.5 kg)   SpO2 98%   BMI 27.43 kg/m   BP/Weight 08/22/2020 04/05/2020 99991111  Systolic BP [id number]   Diastolic BP 88 82 80  Wt. (Lbs) 159.8 166.6 165  BMI 27.43 28.6 28.32    Physical Exam Vitals reviewed.  Constitutional:      Appearance: Normal appearance.  HENT:     Right Ear: Tympanic membrane normal.     Left Ear: Tympanic membrane normal.     Nose: Nose normal.     Mouth/Throat:     Mouth: Mucous membranes are moist.  Cardiovascular:     Rate and Rhythm: Normal rate and regular rhythm.     Pulses: Normal pulses.     Heart sounds: Normal heart sounds.  Pulmonary:     Effort: Pulmonary effort is normal.     Breath sounds: Normal  breath sounds.  Abdominal:     General: Bowel sounds are normal.     Palpations: Abdomen is soft.  Musculoskeletal:        General: Normal range of motion.     Cervical back: Normal range of motion.  Skin:    General: Skin is warm and dry.     Capillary Refill: Capillary refill takes less than 2 seconds.     Findings: Rash (behind left ear and posterior neck at bottom hair line) present.  Neurological:     General: No focal deficit present.     Mental Status: She is alert and oriented to person, place, and time.  Psychiatric:        Mood and Affect: Mood normal.        Behavior: Behavior normal.        Thought Content: Thought content normal.        Judgment: Judgment normal.      Lab Results  Component Value Date   WBC 10.1 02/23/2020   HGB 14.8 02/23/2020   HCT 46.0 02/23/2020   PLT 295 02/23/2020   GLUCOSE 92 02/23/2020   CHOL 204 (H) 02/23/2020   TRIG 94 02/23/2020   HDL 63 02/23/2020   LDLCALC 124 (H) 02/23/2020   ALT 25 02/23/2020   AST 19 02/23/2020   NA 141 02/23/2020   K 4.4 02/23/2020   CL 101 02/23/2020   CREATININE 0.79 02/23/2020   BUN 17 02/23/2020   CO2 24 02/23/2020   TSH 2.180 02/23/2020      Assessment & Plan:   1. Essential hypertension - CBC With Diff/Platelet - Comprehensive metabolic panel  2. Acquired hypothyroidism - TSH  3. Tingling of both feet - CBC With Diff/Platelet -  Comprehensive metabolic panel - TSH - 123456 and Folate Panel  4. Mammogram declined  5. Colonoscopy refused  6. Seborrheic dermatitis - clobetasol ointment (TEMOVATE) 0.05 %; Apply 1 application topically 2 (two) times daily.  Dispense: 30 g; Refill: 0       Apply Clobetasol ointment to rash behind left ear and at lower hair line behind neck Continue medications Monitor BP at home Follow-up in 61-month, fasting or sooner if needed     Follow-up: 648-month An After Visit Summary was printed and given to the patient.   I,Lauren M Auman,acting as a scEducation administratoror ShCIT GroupNP.,have documented all relevant documentation on the behalf of ShRip HarbourNP,as directed by  ShRip HarbourNP while in the presence of ShRip HarbourNP.   I, ShRip HarbourNP, have reviewed all documentation for this visit. The documentation on 08/22/20 for the exam, diagnosis, procedures, and orders are all accurate and complete.    ShRip HarbourNP [phone]

## 2020-08-22 ENCOUNTER — Ambulatory Visit (INDEPENDENT_AMBULATORY_CARE_PROVIDER_SITE_OTHER): Payer: PPO | Admitting: Nurse Practitioner

## 2020-08-22 ENCOUNTER — Other Ambulatory Visit: Payer: Self-pay

## 2020-08-22 ENCOUNTER — Ambulatory Visit: Payer: PPO | Admitting: Family Medicine

## 2020-08-22 ENCOUNTER — Encounter: Payer: Self-pay | Admitting: Nurse Practitioner

## 2020-08-22 VITALS — BP 140/82 | HR 64 | Temp 97.3°F | Ht 64.0 in | Wt 159.8 lb

## 2020-08-22 DIAGNOSIS — Z5320 Procedure and treatment not carried out because of patient's decision for unspecified reasons: Secondary | ICD-10-CM

## 2020-08-22 DIAGNOSIS — L219 Seborrheic dermatitis, unspecified: Secondary | ICD-10-CM | POA: Diagnosis not present

## 2020-08-22 DIAGNOSIS — R202 Paresthesia of skin: Secondary | ICD-10-CM | POA: Diagnosis not present

## 2020-08-22 DIAGNOSIS — E039 Hypothyroidism, unspecified: Secondary | ICD-10-CM

## 2020-08-22 DIAGNOSIS — I10 Essential (primary) hypertension: Secondary | ICD-10-CM

## 2020-08-22 MED ORDER — CLOBETASOL PROPIONATE 0.05 % EX OINT: 1.0000 "application " | TOPICAL_OINTMENT | Freq: Two times a day (BID) | CUTANEOUS | 0 refills | Status: AC

## 2020-08-22 NOTE — Patient Instructions (Addendum)
Apply Clobetasol ointment to rash behind left ear and at lower hair line behind neck Continue medications Monitor BP at home Follow-up in 20-month, fasting or sooner if needed  Managing Your Hypertension Hypertension, also called high blood pressure, is when the force of the blood pressing against the walls of the arteries is too strong. Arteries are blood vessels that carry blood from your heart throughout your body. Hypertension forces the heart to work harder to pump blood and may cause the arteries tobecome narrow or stiff. Understanding blood pressure readings Your personal target blood pressure may vary depending on your medical conditions, your age, and other factors. A blood pressure reading includes a higher number over a lower number. Ideally, your blood pressure should be below 120/80. You should know that: The first, or top, number is called the systolic pressure. It is a measure of the pressure in your arteries as your heart beats. The second, or bottom number, is called the diastolic pressure. It is a measure of the pressure in your arteries as the heart relaxes. Blood pressure is classified into four stages. Based on your blood pressure reading, your health care provider may use the following stages to determine what type of treatment you need, if any. Systolic pressure and diastolicpressure are measured in a unit called mmHg. Normal Systolic pressure: below [id number] Diastolic pressure: below 80. Elevated Systolic pressure: [id number] Diastolic pressure: below 80. Hypertension stage 1 Systolic pressure: 10000000 Diastolic pressure: [id number] Hypertension stage 2 Systolic pressure: [id number] above. Diastolic pressure: 90 or above. How can this condition affect me? Managing your hypertension is an important responsibility. Over time, hypertension can damage the arteries and decrease blood flow to important parts of the body, including the brain, heart, and kidneys. Having untreated or  uncontrolled hypertension can lead to: A heart attack. A stroke. A weakened blood vessel (aneurysm). Heart failure. Kidney damage. Eye damage. Metabolic syndrome. Memory and concentration problems. Vascular dementia. What actions can I take to manage this condition? Hypertension can be managed by making lifestyle changes and possibly by taking medicines. Your health care provider will help you make a plan to bring yourblood pressure within a normal range. Nutrition  Eat a diet that is high in fiber and potassium, and low in salt (sodium), added sugar, and fat. An example eating plan is called the Dietary Approaches to Stop Hypertension (DASH) diet. To eat this way: Eat plenty of fresh fruits and vegetables. Try to fill one-half of your plate at each meal with fruits and vegetables. Eat whole grains, such as whole-wheat pasta, brown rice, or whole-grain bread. Fill about one-fourth of your plate with whole grains. Eat low-fat dairy products. Avoid fatty cuts of meat, processed or cured meats, and poultry with skin. Fill about one-fourth of your plate with lean proteins such as fish, chicken without skin, beans, eggs, and tofu. Avoid pre-made and processed foods. These tend to be higher in sodium, added sugar, and fat. Reduce your daily sodium intake. Most people with hypertension should eat less than 1,500 mg of sodium a day.  Lifestyle  Work with your health care provider to maintain a healthy body weight or to lose weight. Ask what an ideal weight is for you. Get at least 30 minutes of exercise that causes your heart to beat faster (aerobic exercise) most days of the week. Activities may include walking, swimming, or biking. Include exercise to strengthen your muscles (resistance exercise), such as weight lifting, as part of your weekly exercise routine. Try to  do these types of exercises for 30 minutes at least 3 days a week. Do not use any products that contain nicotine or tobacco, such  as cigarettes, e-cigarettes, and chewing tobacco. If you need help quitting, ask your health care provider. Control any long-term (chronic) conditions you have, such as high cholesterol or diabetes. Identify your sources of stress and find ways to manage stress. This may include meditation, deep breathing, or making time for fun activities.  Alcohol use Do not drink alcohol if: Your health care provider tells you not to drink. You are pregnant, may be pregnant, or are planning to become pregnant. If you drink alcohol: Limit how much you use to: 0-1 drink a day for women. 0-2 drinks a day for men. Be aware of how much alcohol is in your drink. In the [place] one drink equals one 12 oz bottle of beer (355 mL), one 5 oz glass of wine (148 mL), or one 1 oz glass of hard liquor (44 mL). Medicines Your health care provider may prescribe medicine if lifestyle changes are not enough to get your blood pressure under control and if: Your systolic blood pressure is 130 or higher. Your diastolic blood pressure is 80 or higher. Take medicines only as told by your health care provider. Follow the directions carefully. Blood pressure medicines must be taken as told by your health care provider. The medicine does not work as well when you skip doses. Skippingdoses also puts you at risk for problems. Monitoring Before you monitor your blood pressure: Do not smoke, drink caffeinated beverages, or exercise within 30 minutes before taking a measurement. Use the bathroom and empty your bladder (urinate). Sit quietly for at least 5 minutes before taking measurements. Monitor your blood pressure at home as told by your health care provider. To do this: Sit with your back straight and supported. Place your feet flat on the floor. Do not cross your legs. Support your arm on a flat surface, such as a table. Make sure your upper arm is at heart level. Each time you measure, take two or three readings one minute apart  and record the results. You may also need to have your blood pressure checked regularly by your healthcare provider. General information Talk with your health care provider about your diet, exercise habits, and other lifestyle factors that may be contributing to hypertension. Review all the medicines you take with your health care provider because there may be side effects or interactions. Keep all visits as told by your health care provider. Your health care provider can help you create and adjust your plan for managing your high blood pressure. Where to find more information National Heart, Lung, and Blood Institute: https://wilson-eaton.com/ American Heart Association: www.heart.org Contact a health care provider if: You think you are having a reaction to medicines you have taken. You have repeated (recurrent) headaches. You feel dizzy. You have swelling in your ankles. You have trouble with your vision. Get help right away if: You develop a severe headache or confusion. You have unusual weakness or numbness, or you feel faint. You have severe pain in your chest or abdomen. You vomit repeatedly. You have trouble breathing. These symptoms may represent a serious problem that is an emergency. Do not wait to see if the symptoms will go away. Get medical help right away. Call your local emergency services (911 in the [place] Do not drive yourself to the hospital. Summary Hypertension is when the force of blood pumping through your arteries  is too strong. If this condition is not controlled, it may put you at risk for serious complications. Your personal target blood pressure may vary depending on your medical conditions, your age, and other factors. For most people, a normal blood pressure is less than 120/80. Hypertension is managed by lifestyle changes, medicines, or both. Lifestyle changes to help manage hypertension include losing weight, eating a healthy, low-sodium diet, exercising more,  stopping smoking, and limiting alcohol. This information is not intended to replace advice given to you by your health care provider. Make sure you discuss any questions you have with your healthcare provider. Document Revised: 02/19/2019 Document Reviewed: 12/15/2018 Elsevier Patient Education  2022 [place].  Seborrheic Dermatitis, Adult Seborrheic dermatitis is a skin disease that causes red, scaly patches. It usually occurs on the scalp, and it is often called dandruff. The patches may appear on other parts of the body. Skin patches tend to appear where there are many oil glands in the skin. Areas of the body that are commonly affected include the: Scalp. Ears. Eyebrows. Face. Bearded area of [employer]. Skin folds of the body, such as the armpits, groin, and buttocks. Chest. The condition may come and go for no known reason, and it is often long-lasting (chronic). What are the causes? The cause of this condition is not known. What increases the risk? The following factors may make you more likely to develop this condition: Having certain conditions, such as: HIV (human immunodeficiency virus). AIDS (acquired immunodeficiency syndrome). Parkinson's disease. Mood disorders, such as depression. Being 74-26 years old. What are the signs or symptoms? Symptoms of this condition include: Thick scales on the scalp. Redness on the face or in the armpits. Skin that is flaky. The flakes may be white or yellow. Skin that seems oily or dry but is not helped with moisturizers. Itching or burning in the affected areas. How is this diagnosed? This condition is diagnosed with a medical history and physical exam. A sample of your skin may be tested (skin biopsy). You may need to see a skin specialist (dermatologist). How is this treated? There is no cure for this condition, but treatment can help to manage the symptoms. You may get treatment to remove scales, lower the risk of skin  infection, and reduce swelling or itching. Treatment may include: Creams that reduce skin yeast. Medicated shampoo. Moisturizing creams or ointments. Creams that reduce swelling and irritation (steroids). Follow these instructions at home: Apply over-the-counter and prescription medicines only as told by your health care provider. Use any medicated shampoo, skin creams, or ointments only as told by your health care provider. Keep all follow-up visits as told by your health care provider. This is important. Contact a health care provider if: Your symptoms do not improve with treatment. Your symptoms get worse. You have new symptoms. Get help right away if: Your condition rapidly worsens with treatment. Summary Seborrheic dermatitis is a skin disease that causes red, scaly patches. Seborrheic dermatitis commonly affects the scalp, face, and skin folds. There is no cure for this condition, but treatment can help to manage the symptoms. This information is not intended to replace advice given to you by your health care provider. Make sure you discuss any questions you have with your healthcare provider. Document Revised: 10/22/2018 Document Reviewed: 10/22/2018 Elsevier Patient Education  [place].

## 2020-08-23 LAB — CBC WITH DIFF/PLATELET
Basophils Absolute: 0.1 10*3/uL (ref 0.0–0.2)
Basos: 1 %
EOS (ABSOLUTE): 0.1 10*3/uL (ref 0.0–0.4)
Eos: 1 %
Hematocrit: 43.5 % (ref 34.0–46.6)
Hemoglobin: 14.3 g/dL (ref 11.1–15.9)
Immature Grans (Abs): 0 10*3/uL (ref 0.0–0.1)
Immature Granulocytes: 0 %
Lymphocytes Absolute: 2.3 10*3/uL (ref 0.7–3.1)
Lymphs: 24 %
MCH: 27.4 pg (ref 26.6–33.0)
MCHC: 32.9 g/dL (ref 31.5–35.7)
MCV: 84 fL (ref 79–97)
Monocytes Absolute: 0.6 10*3/uL (ref 0.1–0.9)
Monocytes: 6 %
Neutrophils Absolute: 6.5 10*3/uL (ref 1.4–7.0)
Neutrophils: 68 %
Platelets: 279 10*3/uL (ref 150–450)
RBC: 5.21 x10E6/uL (ref 3.77–5.28)
RDW: 13.3 % (ref 11.7–15.4)
WBC: 9.6 10*3/uL (ref 3.4–10.8)

## 2020-08-23 LAB — COMPREHENSIVE METABOLIC PANEL
ALT: 19 IU/L (ref 0–32)
AST: 15 IU/L (ref 0–40)
Albumin/Globulin Ratio: 1.7 (ref 1.2–2.2)
Albumin: 4.4 g/dL (ref 3.8–4.8)
Alkaline Phosphatase: 118 IU/L (ref 44–121)
BUN/Creatinine Ratio: 20 (ref 12–28)
BUN: 17 mg/dL (ref 8–27)
Bilirubin Total: 0.7 mg/dL (ref 0.0–1.2)
CO2: 23 mmol/L (ref 20–29)
Calcium: 9.9 mg/dL (ref 8.7–10.3)
Chloride: 106 mmol/L (ref 96–106)
Creatinine, Ser: 0.84 mg/dL (ref 0.57–1.00)
Globulin, Total: 2.6 g/dL (ref 1.5–4.5)
Glucose: 95 mg/dL (ref 65–99)
Potassium: 4.6 mmol/L (ref 3.5–5.2)
Sodium: 143 mmol/L (ref 134–144)
Total Protein: 7 g/dL (ref 6.0–8.5)
eGFR: 75 mL/min/{1.73_m2} (ref >59)

## 2020-08-23 LAB — B12 AND FOLATE PANEL
Folate: >20 ng/mL (ref >3.0)
Vitamin B-12: 994 pg/mL (ref 232–1245)

## 2020-08-23 LAB — TSH: TSH: 1.84 u[IU]/mL (ref 0.450–4.500)

## 2020-11-03 ENCOUNTER — Other Ambulatory Visit: Payer: Self-pay | Admitting: Family Medicine

## 2020-11-03 ENCOUNTER — Other Ambulatory Visit: Payer: Self-pay | Admitting: Physician Assistant

## 2021-01-16 ENCOUNTER — Other Ambulatory Visit: Payer: Self-pay

## 2021-01-16 MED ORDER — MELOXICAM 7.5 MG PO TABS: 7.5000 mg | ORAL_TABLET | Freq: Two times a day (BID) | ORAL | 0 refills | Status: AC

## 2021-02-01 ENCOUNTER — Other Ambulatory Visit: Payer: Self-pay | Admitting: Family Medicine

## 2021-02-26 DIAGNOSIS — E782 Mixed hyperlipidemia: Secondary | ICD-10-CM | POA: Insufficient documentation

## 2021-02-26 NOTE — Progress Notes (Signed)
Subjective:  Patient ID: Erika Shepherd, female    DOB: 08-Feb-1950  Age: 71 y.o. MRN: [id number]  Chief Complaint  Patient presents with   Hyperlipidemia   Hypertension   Hypothyroidism    HPI Hypertension: Currently taking Spironolactone 25 mg daily.   Hyperlipidemia: Patient is not taking any medication, but she is eating healthy. Not exercising for 1 1/2 months.   Hypothyroidism: She takes levothyroxine 25 mcg daily.  Asthma: Albuterol 108 mcg inhaler 2 puffs every 6 hours as needed.  Current Outpatient Medications on File Prior to Visit  Medication Sig Dispense Refill   albuterol (VENTOLIN HFA) 108 (90 Base) MCG/ACT inhaler Inhale 2 puffs into the lungs every 6 (six) hours as needed for wheezing or shortness of breath. 18 g 1   betamethasone dipropionate 0.05 % cream Apply thin layer to affected area every day for 6 weeks then every other day for 6 weeks then 2-3 times weekly PRN.     Cholecalciferol (VITAMIN D3) 2000 units TABS Take 1 capsule by mouth daily.     clobetasol ointment (TEMOVATE) 1.66 % Apply 1 application topically 2 (two) times daily. 30 g 0   levothyroxine (SYNTHROID) 25 MCG tablet TAKE 1 TABLET BY MOUTH ONCE DAILY. 90 tablet 1   meloxicam (MOBIC) 7.5 MG tablet Take 1 tablet (7.5 mg total) by mouth in the morning and at bedtime. 180 tablet 0   potassium chloride (MICRO-K) 10 MEQ CR capsule TAKE 1 CAPSULE BY MOUTH ONCE DAILY. 90 capsule 1   spironolactone (ALDACTONE) 25 MG tablet TAKE 1 TABLET BY MOUTH DAILY. PATIENT SAYS TAKES 1/2 TABLET BY MOUTH TUES,THUR,SAT, AND SUNDAY 45 tablet 0   traMADol (ULTRAM) 50 MG tablet      No current facility-administered medications on file prior to visit.   Past Medical History:  Diagnosis Date   Asthma    Atrophy of thyroid (acquired)    Cellulitis of right external ear    Essential (primary) hypertension    H/O left knee surgery    Dr. Nancie Neas   Migraine without aura, not intractable, without status migrainosus    Mild  intermittent asthma, uncomplicated    Other specified cardiac arrhythmias    PVC (premature ventricular contraction)    Past Surgical History:  Procedure Laterality Date   ABDOMINAL HYSTERECTOMY     CHOLECYSTECTOMY     right shoulder surgery  2006    Family History  Problem Relation Age of Onset   Ovarian cancer Mother    Hypertension Mother    Heart attack Father    Diabetes type II Father    Asthma Brother    Social History   Socioeconomic History   Marital status: Married    Spouse name: Not on file   Number of children: 2   Years of education: Not on file   Highest education level: Not on file  Occupational History   Occupation: [employer]  Tobacco Use   Smoking status: Never   Smokeless tobacco: Never  Vaping Use   Vaping Use: Never used  Substance and Sexual Activity   Alcohol use: Never   Drug use: Never   Sexual activity: Not on file  Other Topics Concern   Not on file  Social History Narrative   Not on file   Social Determinants of Health   Financial Resource Strain: Not on file  Food Insecurity: Not on file  Transportation Needs: Not on file  Physical Activity: Not on file  Stress: Not on file  Social Connections: Not on file    Review of Systems  Constitutional:  Negative for appetite change, fatigue and fever.  HENT:  Negative for congestion, ear pain, sinus pressure and sore throat.   Eyes:  Negative for pain.  Respiratory:  Negative for cough, chest tightness, shortness of breath and wheezing.   Cardiovascular:  Negative for chest pain and palpitations.  Gastrointestinal:  Negative for abdominal pain, constipation, diarrhea, nausea and vomiting.  Genitourinary:  Positive for frequency. Negative for dysuria and hematuria.       Urge incontinence intermittent.  Musculoskeletal:  Negative for arthralgias, back pain, joint swelling and myalgias.  Skin:  Negative for rash.  Neurological:  Negative for dizziness, weakness  and headaches.  Psychiatric/Behavioral:  Negative for dysphoric mood. The patient is not nervous/anxious.    Burning in toes. Usually constant. Has been going on for 8 months.   Objective:  BP 130/82    Pulse 67    Temp 97.9 F (36.6 C) (Temporal)    Ht 5\' 4"  (1.626 m)    Wt 158 lb (71.7 kg)    SpO2 98%    BMI 27.12 kg/m   BP/Weight 02/27/2021 0/86/5784 07/06/6293  Systolic BP 284 132 440  Diastolic BP 82 82 82  Wt. (Lbs) 158 159.8 166.6  BMI 27.12 27.43 28.6    Physical Exam Vitals reviewed.  Constitutional:      Appearance: Normal appearance. She is normal weight.  Neck:     Vascular: No carotid bruit.  Cardiovascular:     Rate and Rhythm: Normal rate and regular rhythm.     Heart sounds: Normal heart sounds.  Pulmonary:     Effort: Pulmonary effort is normal. No respiratory distress.     Breath sounds: Normal breath sounds.  Abdominal:     General: Abdomen is flat. Bowel sounds are normal.     Palpations: Abdomen is soft.     Tenderness: There is no abdominal tenderness.  Neurological:     Mental Status: She is alert and oriented to person, place, and time.  Psychiatric:        Mood and Affect: Mood normal.        Behavior: Behavior normal.    Diabetic Foot Exam - Simple   No data filed      Lab Results  Component Value Date   WBC 9.6 08/22/2020   HGB 14.3 08/22/2020   HCT 43.5 08/22/2020   PLT 279 08/22/2020   GLUCOSE 95 08/22/2020   CHOL 204 (H) 02/23/2020   TRIG 94 02/23/2020   HDL 63 02/23/2020   LDLCALC 124 (H) 02/23/2020   ALT 19 08/22/2020   AST 15 08/22/2020   NA 143 08/22/2020   K 4.6 08/22/2020   CL 106 08/22/2020   CREATININE 0.84 08/22/2020   BUN 17 08/22/2020   CO2 23 08/22/2020   TSH 1.840 08/22/2020      Assessment & Plan:   Problem List Items Addressed This Visit       Cardiovascular and Mediastinum   Essential hypertension - Primary    Well controlled.  No changes to medicines.  Continue to work on eating a healthy diet  and exercise.  Labs drawn today.        Relevant Medications   acebutolol (SECTRAL) 200 MG capsule   Other Relevant Orders   Comprehensive metabolic panel   CBC with Differential/Platelet   PVC (premature ventricular contraction)    Continue acebutolol  Relevant Medications   acebutolol (SECTRAL) 200 MG capsule     Respiratory   Mild intermittent asthma without complication    Albuterol prn        Endocrine   Acquired hypothyroidism    The current medical regimen is effective;  continue present plan and medications.       Relevant Medications   acebutolol (SECTRAL) 200 MG capsule   Other Relevant Orders   TSH     Nervous and Auditory   Neuropathy    No etiology found.  Patient does not want treatment at this time. Does not bother her enough that it warrants treatment.         Other   Mixed stress and urge urinary incontinence    Checked ua.  Send urine culture  Does not want medicine.      Mixed hyperlipidemia    LDL up at last check.  Patient does not want medicine.  Recommend continue to work on eating healthy diet and exercise.       Relevant Medications   acebutolol (SECTRAL) 200 MG capsule   Other Relevant Orders   Lipid panel   Urinary frequency    Check urine culture      Relevant Orders   POCT urinalysis dipstick (Completed)  .  Meds ordered this encounter  Medications   acebutolol (SECTRAL) 200 MG capsule    Sig: Take 1 capsule (200 mg total) by mouth daily. Patient states she takes 1 tablet on Monday, Wednesday, Friday    Dispense:  45 capsule    Refill:  3   Refuses all preventative care.   Orders Placed This Encounter  Procedures   Comprehensive metabolic panel   Lipid panel   CBC with Differential/Platelet   TSH   POCT urinalysis dipstick     Follow-up: Return in about 1 year (around 02/27/2022) for chronic fasting.  An After Visit Summary was printed and given to the patient.    I,Lauren M Auman,acting as a  scribe for Rochel Brome, MD.,have documented all relevant documentation on the behalf of Rochel Brome, MD,as directed by  Rochel Brome, MD while in the presence of Rochel Brome, MD.    Rochel Brome, MD [place] [phone]

## 2021-02-27 ENCOUNTER — Ambulatory Visit: Payer: PPO | Admitting: Family Medicine

## 2021-02-27 ENCOUNTER — Other Ambulatory Visit: Payer: Self-pay

## 2021-02-27 ENCOUNTER — Encounter: Payer: Self-pay | Admitting: Family Medicine

## 2021-02-27 ENCOUNTER — Ambulatory Visit (INDEPENDENT_AMBULATORY_CARE_PROVIDER_SITE_OTHER): Payer: PPO | Admitting: Family Medicine

## 2021-02-27 ENCOUNTER — Other Ambulatory Visit: Payer: PPO

## 2021-02-27 VITALS — BP 130/82 | HR 67 | Temp 97.9°F | Ht 64.0 in | Wt 158.0 lb

## 2021-02-27 DIAGNOSIS — I10 Essential (primary) hypertension: Secondary | ICD-10-CM | POA: Diagnosis not present

## 2021-02-27 DIAGNOSIS — E782 Mixed hyperlipidemia: Secondary | ICD-10-CM | POA: Diagnosis not present

## 2021-02-27 DIAGNOSIS — N3946 Mixed incontinence: Secondary | ICD-10-CM

## 2021-02-27 DIAGNOSIS — E039 Hypothyroidism, unspecified: Secondary | ICD-10-CM | POA: Diagnosis not present

## 2021-02-27 DIAGNOSIS — R350 Frequency of micturition: Secondary | ICD-10-CM | POA: Diagnosis not present

## 2021-02-27 DIAGNOSIS — G629 Polyneuropathy, unspecified: Secondary | ICD-10-CM | POA: Diagnosis not present

## 2021-02-27 DIAGNOSIS — I493 Ventricular premature depolarization: Secondary | ICD-10-CM | POA: Diagnosis not present

## 2021-02-27 DIAGNOSIS — J4520 Mild intermittent asthma, uncomplicated: Secondary | ICD-10-CM

## 2021-02-27 LAB — POCT URINALYSIS DIPSTICK
Bilirubin, UA: NEGATIVE
Blood, UA: NEGATIVE
Glucose, UA: NEGATIVE
Ketones, UA: NEGATIVE
Nitrite, UA: NEGATIVE
Protein, UA: NEGATIVE
Spec Grav, UA: >=1.03 — AB (ref 1.010–1.025)
Urobilinogen, UA: NEGATIVE E.U./dL — AB
pH, UA: 5 (ref 5.0–8.0)

## 2021-02-27 MED ORDER — ACEBUTOLOL HCL 200 MG PO CAPS: 200.0000 mg | ORAL_CAPSULE | Freq: Every day | ORAL | 3 refills | Status: DC

## 2021-02-27 NOTE — Assessment & Plan Note (Signed)
Well controlled.  ?No changes to medicines.  ?Continue to work on eating a healthy diet and exercise.  ?Labs drawn today.  ?

## 2021-02-27 NOTE — Assessment & Plan Note (Signed)
LDL up at last check.  Patient does not want medicine.  Recommend continue to work on eating healthy diet and exercise.

## 2021-02-27 NOTE — Assessment & Plan Note (Signed)
The current medical regimen is effective;  continue present plan and medications.  

## 2021-02-27 NOTE — Assessment & Plan Note (Signed)
No etiology found.  Patient does not want treatment at this time. Does not bother her enough that it warrants treatment.

## 2021-02-27 NOTE — Assessment & Plan Note (Signed)
Checked ua.  Send urine culture  Does not want medicine.

## 2021-02-27 NOTE — Assessment & Plan Note (Signed)
Continue acebutolol

## 2021-02-27 NOTE — Assessment & Plan Note (Signed)
Check urine culture  

## 2021-02-27 NOTE — Assessment & Plan Note (Signed)
Albuterol prn

## 2021-02-27 NOTE — Addendum Note (Signed)
Addended byRochel Brome on: 02/27/2021 02:21 PM   Modules accepted: Orders

## 2021-02-28 LAB — CBC WITH DIFFERENTIAL/PLATELET
Basophils Absolute: 0.1 10*3/uL (ref 0.0–0.2)
Basos: 1 %
EOS (ABSOLUTE): 0.1 10*3/uL (ref 0.0–0.4)
Eos: 2 %
Hematocrit: 44.3 % (ref 34.0–46.6)
Hemoglobin: 14.3 g/dL (ref 11.1–15.9)
Immature Grans (Abs): 0 10*3/uL (ref 0.0–0.1)
Immature Granulocytes: 0 %
Lymphocytes Absolute: 2.4 10*3/uL (ref 0.7–3.1)
Lymphs: 31 %
MCH: 26.7 pg (ref 26.6–33.0)
MCHC: 32.3 g/dL (ref 31.5–35.7)
MCV: 83 fL (ref 79–97)
Monocytes Absolute: 0.7 10*3/uL (ref 0.1–0.9)
Monocytes: 9 %
Neutrophils Absolute: 4.6 10*3/uL (ref 1.4–7.0)
Neutrophils: 57 %
Platelets: 278 10*3/uL (ref 150–450)
RBC: 5.35 x10E6/uL — ABNORMAL HIGH (ref 3.77–5.28)
RDW: 13.2 % (ref 11.7–15.4)
WBC: 7.9 10*3/uL (ref 3.4–10.8)

## 2021-02-28 LAB — COMPREHENSIVE METABOLIC PANEL
ALT: 27 IU/L (ref 0–32)
AST: 24 IU/L (ref 0–40)
Albumin/Globulin Ratio: 1.6 (ref 1.2–2.2)
Albumin: 4.3 g/dL (ref 3.7–4.7)
Alkaline Phosphatase: 114 IU/L (ref 44–121)
BUN/Creatinine Ratio: 19 (ref 12–28)
BUN: 15 mg/dL (ref 8–27)
Bilirubin Total: 0.6 mg/dL (ref 0.0–1.2)
CO2: 24 mmol/L (ref 20–29)
Calcium: 9.9 mg/dL (ref 8.7–10.3)
Chloride: 103 mmol/L (ref 96–106)
Creatinine, Ser: 0.81 mg/dL (ref 0.57–1.00)
Globulin, Total: 2.7 g/dL (ref 1.5–4.5)
Glucose: 97 mg/dL (ref 70–99)
Potassium: 4.7 mmol/L (ref 3.5–5.2)
Sodium: 141 mmol/L (ref 134–144)
Total Protein: 7 g/dL (ref 6.0–8.5)
eGFR: 78 mL/min/{1.73_m2} (ref >59)

## 2021-02-28 LAB — LIPID PANEL
Chol/HDL Ratio: 3.2 ratio (ref 0.0–4.4)
Cholesterol, Total: 205 mg/dL — ABNORMAL HIGH (ref 100–199)
HDL: 65 mg/dL (ref >39)
LDL Chol Calc (NIH): 121 mg/dL — ABNORMAL HIGH (ref 0–99)
Triglycerides: 106 mg/dL (ref 0–149)
VLDL Cholesterol Cal: 19 mg/dL (ref 5–40)

## 2021-02-28 LAB — CARDIOVASCULAR RISK ASSESSMENT

## 2021-02-28 LAB — TSH: TSH: 2.27 u[IU]/mL (ref 0.450–4.500)

## 2021-02-28 NOTE — Progress Notes (Signed)
Blood count normal.  Liver function normal.  Kidney function normal.  Thyroid function normal.  Cholesterol: still little high. Recommend low dose statin. Patient will likely refuse.

## 2021-03-02 ENCOUNTER — Other Ambulatory Visit: Payer: Self-pay

## 2021-03-02 LAB — URINE CULTURE

## 2021-03-02 MED ORDER — NITROFURANTOIN MONOHYD MACRO 100 MG PO CAPS: 100.0000 mg | ORAL_CAPSULE | Freq: Two times a day (BID) | ORAL | 0 refills | Status: DC

## 2021-04-19 ENCOUNTER — Encounter: Payer: Self-pay | Admitting: Nurse Practitioner

## 2021-04-19 ENCOUNTER — Ambulatory Visit (INDEPENDENT_AMBULATORY_CARE_PROVIDER_SITE_OTHER): Payer: PPO | Admitting: Nurse Practitioner

## 2021-04-19 ENCOUNTER — Other Ambulatory Visit: Payer: Self-pay

## 2021-04-19 VITALS — BP 124/86 | HR 77 | Temp 97.7°F | Ht 64.0 in | Wt 161.8 lb

## 2021-04-19 DIAGNOSIS — R051 Acute cough: Secondary | ICD-10-CM | POA: Diagnosis not present

## 2021-04-19 DIAGNOSIS — J4521 Mild intermittent asthma with (acute) exacerbation: Secondary | ICD-10-CM

## 2021-04-19 DIAGNOSIS — J301 Allergic rhinitis due to pollen: Secondary | ICD-10-CM

## 2021-04-19 MED ORDER — PROMETHAZINE-DM 6.25-15 MG/5ML PO SYRP: 5.0000 mL | ORAL_SOLUTION | Freq: Four times a day (QID) | ORAL | 0 refills | Status: DC | PRN

## 2021-04-19 MED ORDER — AZITHROMYCIN 250 MG PO TABS: ORAL_TABLET | ORAL | 0 refills | Status: AC

## 2021-04-19 MED ORDER — FLUTICASONE PROPIONATE 50 MCG/ACT NA SUSP: 2.0000 | Freq: Every day | NASAL | 6 refills | Status: DC

## 2021-04-19 MED ORDER — ALBUTEROL SULFATE HFA 108 (90 BASE) MCG/ACT IN AERS: 2.0000 | INHALATION_SPRAY | Freq: Four times a day (QID) | RESPIRATORY_TRACT | 1 refills | Status: AC | PRN

## 2021-04-19 MED ORDER — LEVOCETIRIZINE DIHYDROCHLORIDE 5 MG PO TABS: 5.0000 mg | ORAL_TABLET | Freq: Every evening | ORAL | 0 refills | Status: DC

## 2021-04-19 NOTE — Progress Notes (Signed)
? ?Acute Office Visit ? ?Subjective:  ? ? Patient ID: Erika Shepherd, female    DOB: 02-10-1950, 71 y.o.   MRN: [id number] ? ?Chief Complaint  ?Patient presents with  ? Chest Pain  ? Cough  ? ? ?HPI: ?Patient is in today for URI symptoms of sinus drainage, congestion, rhinorrhea, post-nasal-drip, and cough. States she has experienced chest tightness. Onset of symptoms was yesterday. Treatment has included "out-of-date" albuterol. chest tightness and cough. She reports seasonal allergies and allergy induced asthma.  ?Past Medical History:  ?Diagnosis Date  ? Asthma   ? Atrophy of thyroid (acquired)   ? Cellulitis of right external ear   ? Essential (primary) hypertension   ? H/O left knee surgery   ? Dr. Nancie Neas  ? Migraine without aura, not intractable, without status migrainosus   ? Mild intermittent asthma, uncomplicated   ? Other specified cardiac arrhythmias   ? PVC (premature ventricular contraction)   ? ? ?Past Surgical History:  ?Procedure Laterality Date  ? ABDOMINAL HYSTERECTOMY    ? CHOLECYSTECTOMY    ? right shoulder surgery  2006  ? ? ?Family History  ?Problem Relation Age of Onset  ? Ovarian cancer Mother   ? Hypertension Mother   ? Heart attack Father   ? Diabetes type II Father   ? Asthma Brother   ? ? ?Social History  ? ?Socioeconomic History  ? Marital status: Married  ?  Spouse name: Not on file  ? Number of children: 2  ? Years of education: Not on file  ? Highest education level: Not on file  ?Occupational History  ? Occupation: Merchandiser, retail of the [employer]  ?Tobacco Use  ? Smoking status: Never  ? Smokeless tobacco: Never  ?Vaping Use  ? Vaping Use: Never used  ?Substance and Sexual Activity  ? Alcohol use: Never  ? Drug use: Never  ? Sexual activity: Not on file  ?Other Topics Concern  ? Not on file  ?Social History Narrative  ? Not on file  ? ?Social Determinants of Health  ? ?Financial Resource Strain: Not on file  ?Food Insecurity: Not on file  ?Transportation Needs: Not on file  ?Physical  Activity: Not on file  ?Stress: Not on file  ?Social Connections: Not on file  ?Intimate Partner Violence: Not on file  ? ? ?Outpatient Medications Prior to Visit  ?Medication Sig Dispense Refill  ? acebutolol (SECTRAL) 200 MG capsule Take 1 capsule (200 mg total) by mouth daily. Patient states she takes 1 tablet on Monday, Wednesday, Friday 45 capsule 3  ? albuterol (VENTOLIN HFA) 108 (90 Base) MCG/ACT inhaler Inhale 2 puffs into the lungs every 6 (six) hours as needed for wheezing or shortness of breath. 18 g 1  ? betamethasone dipropionate 0.05 % cream Apply thin layer to affected area every day for 6 weeks then every other day for 6 weeks then 2-3 times weekly PRN.    ? Cholecalciferol (VITAMIN D3) 2000 units TABS Take 1 capsule by mouth daily.    ? clobetasol ointment (TEMOVATE) 9.45 % Apply 1 application topically 2 (two) times daily. 30 g 0  ? levothyroxine (SYNTHROID) 25 MCG tablet TAKE 1 TABLET BY MOUTH ONCE DAILY. 90 tablet 1  ? meloxicam (MOBIC) 7.5 MG tablet Take 1 tablet (7.5 mg total) by mouth in the morning and at bedtime. 180 tablet 0  ? nitrofurantoin, macrocrystal-monohydrate, (MACROBID) 100 MG capsule Take 1 capsule (100 mg total) by mouth 2 (two) times daily. 14 capsule  0  ? potassium chloride (MICRO-K) 10 MEQ CR capsule TAKE 1 CAPSULE BY MOUTH ONCE DAILY. 90 capsule 1  ? spironolactone (ALDACTONE) 25 MG tablet TAKE 1 TABLET BY MOUTH DAILY. PATIENT SAYS TAKES 1/2 TABLET BY MOUTH TUES,THUR,SAT, AND SUNDAY 45 tablet 0  ? traMADol (ULTRAM) 50 MG tablet     ? ?No facility-administered medications prior to visit.  ? ? ?Allergies  ?Allergen Reactions  ? Diovan [Valsartan]   ? Labetalol Hcl   ? Lisinopril   ? Toprol Xl [Metoprolol]   ? Ibuprofen Palpitations  ? Sulfamethoxazole Palpitations  ? ? ?Review of Systems  ?Constitutional:  Positive for fatigue. Negative for appetite change, chills and fever.  ?HENT:  Positive for congestion, ear pain (bilateral ear fullness), postnasal drip, rhinorrhea,  sneezing and sore throat. Negative for ear discharge, sinus pressure and sinus pain.   ?Eyes:  Negative for visual disturbance.  ?Respiratory:  Positive for cough, chest tightness and wheezing. Negative for shortness of breath.   ?Cardiovascular:  Negative for chest pain, palpitations and leg swelling.  ?Gastrointestinal:  Negative for abdominal pain, diarrhea, nausea and vomiting.  ?Endocrine: Negative for polydipsia, polyphagia and polyuria.  ?Genitourinary:  Negative for difficulty urinating, dysuria, frequency, hematuria, menstrual problem, urgency, vaginal bleeding, vaginal discharge and vaginal pain.  ?Musculoskeletal:  Negative for back pain, gait problem, joint swelling, myalgias and neck pain.  ?Allergic/Immunologic: Positive for environmental allergies.  ?Neurological:  Negative for dizziness, seizures, syncope, weakness, numbness and headaches.  ?Psychiatric/Behavioral:  Negative for agitation, confusion, hallucinations, sleep disturbance and suicidal ideas. The patient is not nervous/anxious.   ? ?   ?Objective:  ?  ?Physical Exam ?Vitals reviewed.  ?Constitutional:   ?   Appearance: She is ill-appearing.  ?HENT:  ?   Nose: Congestion and rhinorrhea present.  ?   Mouth/Throat:  ?   Pharynx: Posterior oropharyngeal erythema present.  ?Cardiovascular:  ?   Rate and Rhythm: Normal rate and regular rhythm.  ?Pulmonary:  ?   Effort: Pulmonary effort is normal.  ?   Breath sounds: Examination of the right-upper field reveals wheezing. Examination of the left-upper field reveals wheezing. Examination of the right-middle field reveals wheezing. Examination of the left-middle field reveals wheezing. Wheezing present.  ?Lymphadenopathy:  ?   Cervical: No cervical adenopathy.  ?Skin: ?   General: Skin is warm and dry.  ?   Capillary Refill: Capillary refill takes less than 2 seconds.  ?Neurological:  ?   General: No focal deficit present.  ?   Mental Status: She is alert.  ?Psychiatric:     ?   Mood and Affect:  Mood normal.     ?   Behavior: Behavior normal.  ? ? ?BP 124/86   Pulse 77   Temp 97.7 ?F (36.5 ?C)   Ht '5\' 4"'  (1.626 m)   Wt 161 lb 12.8 oz (73.4 kg)   SpO2 96%   BMI 27.77 kg/m?   ?Wt Readings from Last 3 Encounters:  ?02/27/21 158 lb (71.7 kg)  ?08/22/20 159 lb 12.8 oz (72.5 kg)  ?04/05/20 166 lb 9.6 oz (75.6 kg)  ? ? ? ? ? ? ? ?Lab Results  ?Component Value Date  ? TSH 2.270 02/27/2021  ? ?Lab Results  ?Component Value Date  ? WBC 7.9 02/27/2021  ? HGB 14.3 02/27/2021  ? HCT 44.3 02/27/2021  ? MCV 83 02/27/2021  ? PLT 278 02/27/2021  ? ?Lab Results  ?Component Value Date  ? NA 141 02/27/2021  ? K  4.7 02/27/2021  ? CO2 24 02/27/2021  ? GLUCOSE 97 02/27/2021  ? BUN 15 02/27/2021  ? CREATININE 0.81 02/27/2021  ? BILITOT 0.6 02/27/2021  ? ALKPHOS 114 02/27/2021  ? AST 24 02/27/2021  ? ALT 27 02/27/2021  ? PROT 7.0 02/27/2021  ? ALBUMIN 4.3 02/27/2021  ? CALCIUM 9.9 02/27/2021  ? EGFR 78 02/27/2021  ? ?Lab Results  ?Component Value Date  ? CHOL 205 (H) 02/27/2021  ? ?Lab Results  ?Component Value Date  ? HDL 65 02/27/2021  ? ?Lab Results  ?Component Value Date  ? LDLCALC 121 (H) 02/27/2021  ? ?Lab Results  ?Component Value Date  ? TRIG 106 02/27/2021  ? ?Lab Results  ?Component Value Date  ? CHOLHDL 3.2 02/27/2021  ? ? ? ?   ?Assessment & Plan:  ? ?1. Seasonal allergic rhinitis due to pollen ?- fluticasone (FLONASE) 50 MCG/ACT nasal spray; Place 2 sprays into both nostrils daily.  Dispense: 16 g; Refill: 6 ?- levocetirizine (XYZAL) 5 MG tablet; Take 1 tablet (5 mg total) by mouth every evening.  Dispense: 90 tablet; Refill: 0 ?- azithromycin (ZITHROMAX) 250 MG tablet; Take 2 tablets on day 1, then 1 tablet daily on days 2 through 5  Dispense: 6 tablet; Refill: 0 ? ?2. Mild intermittent extrinsic asthma with acute exacerbation ?- albuterol (VENTOLIN HFA) 108 (90 Base) MCG/ACT inhaler; Inhale 2 puffs into the lungs every 6 (six) hours as needed for wheezing or shortness of breath.  Dispense: 18 g; Refill: 1 ? ?3.  Acute cough ?- promethazine-dextromethorphan (PROMETHAZINE-DM) 6.25-15 MG/5ML syrup; Take 5 mLs by mouth 4 (four) times daily as needed.  Dispense: 118 mL; Refill: 0 ?  ? ? ? Take Z-pack as directed ?Use Flonase n

## 2021-04-19 NOTE — Patient Instructions (Addendum)
Take Z-pack as directed ?Use Flonase nasal spray daily ?Take Xyzal 5 mg daily for allergies ?Take cough syrup up to 4 times daily as needed ?Use Albuterol as needed for chest tightness and shortness of breath ?Rest and push fluids ?Take Mucinex over the counter for cough/congestion ? ?Sinusitis, Adult ?Sinusitis is soreness and swelling (inflammation) of your sinuses. Sinuses are hollow spaces in the bones around your face. They are located: ?Around your eyes. ?In the middle of your forehead. ?Behind your nose. ?In your cheekbones. ?Your sinuses and nasal passages are lined with a fluid called mucus. Mucus drains out of your sinuses. Swelling can trap mucus in your sinuses. This lets germs (bacteria, virus, or fungus) grow, which leads to infection. Most of the time, this condition is caused by a virus. ?What are the causes? ?This condition is caused by: ?Allergies. ?Asthma. ?Germs. ?Things that block your nose or sinuses. ?Growths in the nose (nasal polyps). ?Chemicals or irritants in the air. ?Fungus (rare). ?What increases the risk? ?You are more likely to develop this condition if: ?You have a weak body defense system (immune system). ?You do a lot of swimming or diving. ?You use nasal sprays too much. ?You smoke. ?What are the signs or symptoms? ?The main symptoms of this condition are pain and a feeling of pressure around the sinuses. Other symptoms include: ?Stuffy nose (congestion). ?Runny nose (drainage). ?Swelling and warmth in the sinuses. ?Headache. ?Toothache. ?A cough that may get worse at night. ?Mucus that collects in the throat or the back of the nose (postnasal drip). ?Being unable to smell and taste. ?Being very tired (fatigue). ?A fever. ?Sore throat. ?Bad breath. ?How is this diagnosed? ?This condition is diagnosed based on: ?Your symptoms. ?Your medical history. ?A physical exam. ?Tests to find out if your condition is short-term (acute) or long-term (chronic). Your doctor may: ?Check your nose  for growths (polyps). ?Check your sinuses using a tool that has a light (endoscope). ?Check for allergies or germs. ?Do imaging tests, such as an MRI or CT scan. ?How is this treated? ?Treatment for this condition depends on the cause and whether it is short-term or long-term. ?If caused by a virus, your symptoms should go away on their own within 10 days. You may be given medicines to relieve symptoms. They include: ?Medicines that shrink swollen tissue in the nose. ?Medicines that treat allergies (antihistamines). ?A spray that treats swelling of the nostrils.  ?Rinses that help get rid of thick mucus in your nose (nasal saline washes). ?If caused by bacteria, your doctor may wait to see if you will get better without treatment. You may be given antibiotic medicine if you have: ?A very bad infection. ?A weak body defense system. ?If caused by growths in the nose, you may need to have surgery. ?Follow these instructions at home: ?Medicines ?Take, use, or apply over-the-counter and prescription medicines only as told by your doctor. These may include nasal sprays. ?If you were prescribed an antibiotic medicine, take it as told by your doctor. Do not stop taking the antibiotic even if you start to feel better. ?Hydrate and humidify ? ?Drink enough water to keep your pee (urine) pale yellow. ?Use a cool mist humidifier to keep the humidity level in your home above 50%. ?Breathe in steam for 10-15 minutes, 3-4 times a day, or as told by your doctor. You can do this in the bathroom while a hot shower is running. ?Try not to spend time in cool or dry air. ?  Rest ?Rest as much as you can. ?Sleep with your head raised (elevated). ?Make sure you get enough sleep each night. ?General instructions ? ?Put a warm, moist washcloth on your face 3-4 times a day, or as often as told by your doctor. This will help with discomfort. ?Wash your hands often with soap and water. If there is no soap and water, use hand sanitizer. ?Do not  smoke. Avoid being around people who are smoking (secondhand smoke). ?Keep all follow-up visits as told by your doctor. This is important. ?Contact a doctor if: ?You have a fever. ?Your symptoms get worse. ?Your symptoms do not get better within 10 days. ?Get help right away if: ?You have a very bad headache. ?You cannot stop throwing up (vomiting). ?You have very bad pain or swelling around your face or eyes. ?You have trouble seeing. ?You feel confused. ?Your neck is stiff. ?You have trouble breathing. ?Summary ?Sinusitis is swelling of your sinuses. Sinuses are hollow spaces in the bones around your face. ?This condition is caused by tissues in your nose that become inflamed or swollen. This traps germs. These can lead to infection. ?If you were prescribed an antibiotic medicine, take it as told by your doctor. Do not stop taking it even if you start to feel better. ?Keep all follow-up visits as told by your doctor. This is important. ?This information is not intended to replace advice given to you by your health care provider. Make sure you discuss any questions you have with your health care provider. ?Document Revised: 06/16/2017 Document Reviewed: 06/16/2017 ?Elsevier Patient Education ? [place]. ?Allergic Rhinitis, Adult ?Allergic rhinitis is an allergic reaction that affects the mucous membrane inside the nose. The mucous membrane is the tissue that produces mucus. ?There are two types of allergic rhinitis: ?Seasonal. This type is also called hay fever and happens only during certain seasons. ?Perennial. This type can happen at any time of the year. ?Allergic rhinitis cannot be spread from person to person. This condition can be mild, moderate, or severe. It can develop at any age and may be outgrown. ?What are the causes? ?This condition is caused by allergens. These are things that can cause an allergic reaction. Allergens may differ for seasonal allergic rhinitis and perennial allergic  rhinitis. ?Seasonal allergic rhinitis is triggered by pollen. Pollen can come from grasses, trees, and weeds. ?Perennial allergic rhinitis may be triggered by: ?Dust mites. ?Proteins in a pet's urine, saliva, or dander. Dander is dead skin cells from a pet. ?Smoke, mold, or car fumes. ?What increases the risk? ?You are more likely to develop this condition if you have a family history of allergies or other conditions related to allergies, including: ?Allergic conjunctivitis. This is inflammation of parts of the eyes and eyelids. ?Asthma. This condition affects the lungs and makes it hard to breathe. ?Atopic dermatitis or eczema. This is long term (chronic) inflammation of the skin. ?Food allergies. ?What are the signs or symptoms? ?Symptoms of this condition include: ?Sneezing or coughing. ?A stuffy nose (nasal congestion), itchy nose, or nasal discharge. ?Itchy eyes and tearing of the eyes. ?A feeling of mucus dripping down the back of your throat (postnasal drip). ?Trouble sleeping. ?Tiredness or fatigue. ?Headache. ?Sore throat. ?How is this diagnosed? ?This condition may be diagnosed with your symptoms, medical history, and physical exam. Your health care provider may check for related conditions, such as: ?Asthma. ?Pink eye. This is eye inflammation caused by infection (conjunctivitis). ?Ear infection. ?Upper respiratory infection. This  is an infection in the nose, throat, or upper airways. ?You may also have tests to find out which allergens trigger your symptoms. These may include skin tests or blood tests. ?How is this treated? ?There is no cure for this condition, but treatment can help control symptoms. Treatment may include: ?Taking medicines that block allergy symptoms, such as corticosteroids and antihistamines. Medicine may be given as a shot, nasal spray, or pill. ?Avoiding any allergens. ?Being exposed again and again to tiny amounts of allergens to help you build a defense against allergens  (immunotherapy). This is done if other treatments have not helped. It may include: ?Allergy shots. These are injected medicines that have small amounts of allergen in them. ?Sublingual immunotherapy. This involves ta

## 2021-05-03 ENCOUNTER — Other Ambulatory Visit: Payer: Self-pay | Admitting: Family Medicine

## 2021-05-07 ENCOUNTER — Other Ambulatory Visit: Payer: Self-pay | Admitting: Family Medicine

## 2021-05-07 DIAGNOSIS — E039 Hypothyroidism, unspecified: Secondary | ICD-10-CM

## 2021-05-07 DIAGNOSIS — E876 Hypokalemia: Secondary | ICD-10-CM

## 2021-08-13 ENCOUNTER — Ambulatory Visit: Payer: PPO | Admitting: Podiatry

## 2021-08-13 ENCOUNTER — Encounter: Payer: Self-pay | Admitting: Podiatry

## 2021-08-13 DIAGNOSIS — G629 Polyneuropathy, unspecified: Secondary | ICD-10-CM | POA: Diagnosis not present

## 2021-08-13 NOTE — Progress Notes (Signed)
  Subjective:  Patient ID: Erika Shepherd, female    DOB: Jan 11, 1951,   MRN: [id number]  Chief Complaint  Patient presents with   Toe Pain    71 y.o. female presents for concern of tingingling and pain in her toes onboth feet that has been going on for 6+ months. Relates she does occasionally get numbness and they sensations will come and go mostly when she is sitting. Denies any treatments.  . Denies any other pedal complaints. Denies n/v/f/c.   Past Medical History:  Diagnosis Date   Asthma    Atrophy of thyroid (acquired)    Cellulitis of right external ear    Essential (primary) hypertension    H/O left knee surgery    Dr. Nancie Neas   Migraine without aura, not intractable, without status migrainosus    Mild intermittent asthma, uncomplicated    Other specified cardiac arrhythmias    PVC (premature ventricular contraction)     Objective:  Physical Exam: Vascular: DP/PT pulses 2/4 bilateral. CFT <3 seconds. Normal hair growth on digits. No edema.  Skin. No lacerations or abrasions bilateral feet.  Musculoskeletal: MMT 5/5 bilateral lower extremities in DF, PF, Inversion and Eversion. Deceased ROM in DF of ankle joint. No pain to palpation about the feet.  Neurological: Sensation intact to light touch. Protective sensation intact.   Assessment:   1. Neuropathy      Plan:  Patient was evaluated and treated and all questions answered. Discussed neuropathy and etiology as well as treatment with patient.  Radiographs reviewed and discussed with patient.  -Discussed and educated patient on diabetic foot care, especially with  regards to the vascular, neurological and musculoskeletal systems.  -Stressed the importance of good glycemic control and the detriment of not  controlling glucose levels in relation to the foot. -Discussed supportive shoes at all times and checking feet regularly.  -Discussed topical creams.  -Deferred any oral medications.  -Discussed following up with  PCP for back evaluaiton as this could be related.  -Patient to return as needed.    Lorenda Peck, DPM

## 2021-08-28 ENCOUNTER — Encounter: Payer: Self-pay | Admitting: Family Medicine

## 2021-08-28 ENCOUNTER — Ambulatory Visit (INDEPENDENT_AMBULATORY_CARE_PROVIDER_SITE_OTHER): Payer: PPO | Admitting: Family Medicine

## 2021-08-28 VITALS — BP 136/64 | HR 68 | Temp 97.2°F | Ht 64.0 in | Wt 166.0 lb

## 2021-08-28 DIAGNOSIS — R519 Headache, unspecified: Secondary | ICD-10-CM | POA: Insufficient documentation

## 2021-08-28 DIAGNOSIS — I10 Essential (primary) hypertension: Secondary | ICD-10-CM

## 2021-08-28 DIAGNOSIS — E782 Mixed hyperlipidemia: Secondary | ICD-10-CM

## 2021-08-28 DIAGNOSIS — E039 Hypothyroidism, unspecified: Secondary | ICD-10-CM | POA: Diagnosis not present

## 2021-08-28 NOTE — Progress Notes (Signed)
Subjective:  Patient ID: Erika Shepherd, female    DOB: [birth year]  Age: 71 y.o. MRN: [id number]  Chief Complaint  Patient presents with   Hypertension   Hyperlipidemia   Hypothyroidism    HPI  Hypertension: Currently taking Spironolactone 25 mg daily, acebutolol 200 mg once daily, potassium chloride 10 meq once daily.    Hyperlipidemia: Patient is not taking any medication, but she is eating healthy. Not exercising for 1 1/2 months. Refuses statins.   Hypothyroidism: She takes levothyroxine 25 mcg daily.  Asthma: Albuterol 108 mcg inhaler 2 puffs every 6 hours as needed.  On Saturday she had an ocular migraine. Sharp pain in eye. Comes and goes. Comes for 30 seconds. No loss of sight. No nausea or vomiting, but developed headache. Had photophobia. No phonophobia. Took an nsaid, she thinks it was meloxicam.  Current Outpatient Medications on File Prior to Visit  Medication Sig Dispense Refill   acebutolol (SECTRAL) 200 MG capsule Take 1 capsule (200 mg total) by mouth daily. Patient states she takes 1 tablet on Monday, Wednesday, Friday 45 capsule 3   albuterol (VENTOLIN HFA) 108 (90 Base) MCG/ACT inhaler Inhale 2 puffs into the lungs every 6 (six) hours as needed for wheezing or shortness of breath. 18 g 1   betamethasone dipropionate 0.05 % cream Apply thin layer to affected area every day for 6 weeks then every other day for 6 weeks then 2-3 times weekly PRN.     Cholecalciferol (VITAMIN D3) 2000 units TABS Take 1 capsule by mouth daily.     clobetasol ointment (TEMOVATE) 5.91 % Apply 1 application topically 2 (two) times daily. 30 g 0   levothyroxine (SYNTHROID) 25 MCG tablet TAKE 1 TABLET BY MOUTH ONCE DAILY. 90 tablet 2   meloxicam (MOBIC) 7.5 MG tablet Take 1 tablet (7.5 mg total) by mouth in the morning and at bedtime. 180 tablet 0   potassium chloride (MICRO-K) 10 MEQ CR capsule TAKE 1 CAPSULE BY MOUTH ONCE DAILY. 90 capsule 2   spironolactone (ALDACTONE) 25 MG tablet TAKE 1  TABLET BY MOUTH DAILY. PATIENT SAYS TAKES 1/2 TABLET BY MOUTH TUES,THUR,SAT, AND SUNDAY 45 tablet 1   traMADol (ULTRAM) 50 MG tablet      No current facility-administered medications on file prior to visit.   Past Medical History:  Diagnosis Date   Asthma    Atrophy of thyroid (acquired)    Cellulitis of right external ear    Essential (primary) hypertension    H/O left knee surgery    Dr. Nancie Neas   Migraine without aura, not intractable, without status migrainosus    Mild intermittent asthma, uncomplicated    Other specified cardiac arrhythmias    PVC (premature ventricular contraction)    Past Surgical History:  Procedure Laterality Date   ABDOMINAL HYSTERECTOMY     CHOLECYSTECTOMY     right shoulder surgery  2006    Family History  Problem Relation Age of Onset   Ovarian cancer Mother    Hypertension Mother    Heart attack Father    Diabetes type II Father    Asthma Brother    Social History   Socioeconomic History   Marital status: Married    Spouse name: Not on file   Number of children: 2   Years of education: Not on file   Highest education level: Not on file  Occupational History   Occupation: [employer]  Tobacco Use   Smoking status: Never  Smokeless tobacco: Never  Vaping Use   Vaping Use: Never used  Substance and Sexual Activity   Alcohol use: Never   Drug use: Never   Sexual activity: Not on file  Other Topics Concern   Not on file  Social History Narrative   Not on file   Social Determinants of Health   Financial Resource Strain: Not on file  Food Insecurity: Not on file  Transportation Needs: Not on file  Physical Activity: Not on file  Stress: Not on file  Social Connections: Not on file    Review of Systems  Constitutional:  Negative for chills, fatigue and fever.  HENT:  Negative for congestion, ear pain and sore throat.   Respiratory:  Negative for cough and shortness of breath.   Cardiovascular:   Negative for chest pain and palpitations.  Gastrointestinal:  Negative for abdominal pain, constipation, diarrhea, nausea and vomiting.  Genitourinary:  Negative for dysuria and urgency.  Musculoskeletal:  Negative for arthralgias and myalgias.  Skin:  Negative for rash.  Neurological:  Positive for headaches (ocular migraine on Saturday.). Negative for dizziness.  Psychiatric/Behavioral:  Negative for dysphoric mood and sleep disturbance. The patient is not nervous/anxious.    Objective:  BP 136/64   Pulse 68   Temp (!) 97.2 F (36.2 C)   Ht '5\' 4"'  (1.626 m)   Wt 166 lb (75.3 kg)   SpO2 97%   BMI 28.49 kg/m      08/28/2021    9:23 AM 04/19/2021    2:04 PM 02/27/2021    9:37 AM  BP/Weight  Systolic BP 242 353 614  Diastolic BP 64 86 82  Wt. (Lbs) 166 161.8   BMI 28.49 kg/m2 27.77 kg/m2     Physical Exam Vitals reviewed.  Constitutional:      Appearance: Normal appearance. She is normal weight.  HENT:     Mouth/Throat:     Pharynx: No posterior oropharyngeal erythema.  Eyes:     Extraocular Movements: Extraocular movements intact.     Conjunctiva/sclera: Conjunctivae normal.     Pupils: Pupils are equal, round, and reactive to light.     Comments: Nontender over right temple.  Neck:     Vascular: No carotid bruit.  Cardiovascular:     Rate and Rhythm: Normal rate and regular rhythm.     Heart sounds: Normal heart sounds.  Pulmonary:     Effort: Pulmonary effort is normal. No respiratory distress.     Breath sounds: Normal breath sounds.  Abdominal:     General: Abdomen is flat. Bowel sounds are normal.     Palpations: Abdomen is soft.     Tenderness: There is no abdominal tenderness.  Neurological:     Mental Status: She is alert and oriented to person, place, and time.  Psychiatric:        Mood and Affect: Mood normal.        Behavior: Behavior normal.    Diabetic Foot Exam - Simple   No data filed      Lab Results  Component Value Date   WBC 7.9  02/27/2021   HGB 14.3 02/27/2021   HCT 44.3 02/27/2021   PLT 278 02/27/2021   GLUCOSE 97 02/27/2021   CHOL 205 (H) 02/27/2021   TRIG 106 02/27/2021   HDL 65 02/27/2021   LDLCALC 121 (H) 02/27/2021   ALT 27 02/27/2021   AST 24 02/27/2021   NA 141 02/27/2021   K 4.7 02/27/2021   CL 103  02/27/2021   CREATININE 0.81 02/27/2021   BUN 15 02/27/2021   CO2 24 02/27/2021   TSH 2.270 02/27/2021      Assessment & Plan:   Problem List Items Addressed This Visit       Cardiovascular and Mediastinum   Essential hypertension - Primary    Well controlled.  No changes to medicines.  Continue to work on eating a healthy diet and exercise.  Labs drawn today.        Relevant Orders   CBC with Differential/Platelet   Comprehensive metabolic panel   Lipid panel     Endocrine   Acquired hypothyroidism    Previously well controlled Continue Synthroid at current dose  Recheck TSH and adjust Synthroid as indicated         Other   Mixed hyperlipidemia    Not at goal  Refuses medicines.  Recommend continue to work on eating healthy diet and exercise.       Temporal headache    Check esr stat.       Relevant Orders   Sedimentation rate  .  Orders Placed This Encounter  Procedures   CBC with Differential/Platelet   Comprehensive metabolic panel   Lipid panel   Sedimentation rate     Follow-up: Return in about 1 year (around 08/29/2022) for chronic fasting, awv.  An After Visit Summary was printed and given to the patient.  Rochel Brome, MD [facility] [phone]

## 2021-08-28 NOTE — Assessment & Plan Note (Signed)
Previously well controlled Continue Synthroid at current dose  Recheck TSH and adjust Synthroid as indicated   

## 2021-08-28 NOTE — Assessment & Plan Note (Signed)
Not at goal  Refuses medicines.  Recommend continue to work on eating healthy diet and exercise.

## 2021-08-28 NOTE — Assessment & Plan Note (Signed)
Check esr stat.  

## 2021-08-28 NOTE — Assessment & Plan Note (Signed)
Well controlled.  ?No changes to medicines.  ?Continue to work on eating a healthy diet and exercise.  ?Labs drawn today.  ?

## 2021-08-29 LAB — CBC WITH DIFFERENTIAL/PLATELET
Basophils Absolute: 0.1 10*3/uL (ref 0.0–0.2)
Basos: 1 %
EOS (ABSOLUTE): 0.2 10*3/uL (ref 0.0–0.4)
Eos: 3 %
Hematocrit: 42.2 % (ref 34.0–46.6)
Hemoglobin: 14 g/dL (ref 11.1–15.9)
Immature Grans (Abs): 0 10*3/uL (ref 0.0–0.1)
Immature Granulocytes: 0 %
Lymphocytes Absolute: 2.4 10*3/uL (ref 0.7–3.1)
Lymphs: 30 %
MCH: 27.4 pg (ref 26.6–33.0)
MCHC: 33.2 g/dL (ref 31.5–35.7)
MCV: 83 fL (ref 79–97)
Monocytes Absolute: 0.6 10*3/uL (ref 0.1–0.9)
Monocytes: 8 %
Neutrophils Absolute: 4.9 10*3/uL (ref 1.4–7.0)
Neutrophils: 58 %
Platelets: 269 10*3/uL (ref 150–450)
RBC: 5.11 x10E6/uL (ref 3.77–5.28)
RDW: 13.1 % (ref 11.7–15.4)
WBC: 8.3 10*3/uL (ref 3.4–10.8)

## 2021-08-29 LAB — COMPREHENSIVE METABOLIC PANEL
ALT: 26 IU/L (ref 0–32)
AST: 23 IU/L (ref 0–40)
Albumin/Globulin Ratio: 1.5 (ref 1.2–2.2)
Albumin: 4.2 g/dL (ref 3.8–4.8)
Alkaline Phosphatase: 124 IU/L — ABNORMAL HIGH (ref 44–121)
BUN/Creatinine Ratio: 20 (ref 12–28)
BUN: 16 mg/dL (ref 8–27)
Bilirubin Total: 0.4 mg/dL (ref 0.0–1.2)
CO2: 23 mmol/L (ref 20–29)
Calcium: 9.6 mg/dL (ref 8.7–10.3)
Chloride: 103 mmol/L (ref 96–106)
Creatinine, Ser: 0.8 mg/dL (ref 0.57–1.00)
Globulin, Total: 2.8 g/dL (ref 1.5–4.5)
Glucose: 88 mg/dL (ref 70–99)
Potassium: 4.6 mmol/L (ref 3.5–5.2)
Sodium: 141 mmol/L (ref 134–144)
Total Protein: 7 g/dL (ref 6.0–8.5)
eGFR: 79 mL/min/{1.73_m2} (ref >59)

## 2021-08-29 LAB — LIPID PANEL
Chol/HDL Ratio: 3.2 ratio (ref 0.0–4.4)
Cholesterol, Total: 201 mg/dL — ABNORMAL HIGH (ref 100–199)
HDL: 63 mg/dL (ref >39)
LDL Chol Calc (NIH): 123 mg/dL — ABNORMAL HIGH (ref 0–99)
Triglycerides: 85 mg/dL (ref 0–149)
VLDL Cholesterol Cal: 15 mg/dL (ref 5–40)

## 2021-08-29 LAB — CARDIOVASCULAR RISK ASSESSMENT

## 2021-08-29 LAB — SEDIMENTATION RATE: Sed Rate: 9 mm/hr (ref 0–40)

## 2022-02-04 ENCOUNTER — Other Ambulatory Visit: Payer: Self-pay

## 2022-02-04 DIAGNOSIS — E876 Hypokalemia: Secondary | ICD-10-CM

## 2022-02-04 MED ORDER — POTASSIUM CHLORIDE ER 10 MEQ PO CPCR: 10.0000 meq | ORAL_CAPSULE | Freq: Every day | ORAL | 1 refills | Status: DC

## 2022-02-06 ENCOUNTER — Other Ambulatory Visit: Payer: Self-pay

## 2022-02-06 DIAGNOSIS — E876 Hypokalemia: Secondary | ICD-10-CM

## 2022-02-06 MED ORDER — POTASSIUM CHLORIDE ER 10 MEQ PO CPCR: 10.0000 meq | ORAL_CAPSULE | Freq: Every day | ORAL | 1 refills | Status: AC

## 2022-04-23 ENCOUNTER — Other Ambulatory Visit: Payer: Self-pay | Admitting: Family Medicine

## 2022-04-23 DIAGNOSIS — E039 Hypothyroidism, unspecified: Secondary | ICD-10-CM

## 2022-04-23 MED ORDER — LEVOTHYROXINE SODIUM 25 MCG PO TABS: 25.0000 ug | ORAL_TABLET | Freq: Every day | ORAL | 2 refills | Status: DC

## 2022-05-28 ENCOUNTER — Other Ambulatory Visit: Payer: Self-pay | Admitting: Family Medicine

## 2022-08-30 ENCOUNTER — Encounter: Payer: PPO | Admitting: Family Medicine

## 2023-10-28 ENCOUNTER — Other Ambulatory Visit: Payer: Self-pay | Admitting: Family Medicine

## 2023-10-28 DIAGNOSIS — E039 Hypothyroidism, unspecified: Secondary | ICD-10-CM

## 2023-10-31 ENCOUNTER — Other Ambulatory Visit: Payer: Self-pay

## 2023-10-31 DIAGNOSIS — E039 Hypothyroidism, unspecified: Secondary | ICD-10-CM

## 2023-10-31 MED ORDER — LEVOTHYROXINE SODIUM 25 MCG PO TABS: 25.0000 ug | ORAL_TABLET | Freq: Every day | ORAL | 1 refills | Status: AC
